# Patient Record
Sex: Male | Born: 1970 | Race: White | Hispanic: No | Marital: Married | Smoking: Never smoker
Health system: Southern US, Community
[De-identification: ages and names within clinical notes are randomized; demographics above are authoritative.]

## PROBLEM LIST (undated history)

## (undated) DIAGNOSIS — E785 Hyperlipidemia, unspecified: Secondary | ICD-10-CM

## (undated) DIAGNOSIS — K219 Gastro-esophageal reflux disease without esophagitis: Secondary | ICD-10-CM

## (undated) DIAGNOSIS — J309 Allergic rhinitis, unspecified: Secondary | ICD-10-CM

## (undated) DIAGNOSIS — E119 Type 2 diabetes mellitus without complications: Secondary | ICD-10-CM

## (undated) DIAGNOSIS — S335XXA Sprain of ligaments of lumbar spine, initial encounter: Secondary | ICD-10-CM

## (undated) HISTORY — PX: BACK SURGERY: SHX140

## (undated) HISTORY — DX: Allergic rhinitis, unspecified: J30.9

## (undated) HISTORY — DX: Hyperlipidemia, unspecified: E78.5

## (undated) HISTORY — DX: Gastro-esophageal reflux disease without esophagitis: K21.9

## (undated) HISTORY — DX: Sprain of ligaments of lumbar spine, initial encounter: S33.5XXA

---
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
## 2000-04-22 ENCOUNTER — Emergency Department (HOSPITAL_COMMUNITY): Admission: EM | Admit: 2000-04-22 | Discharge: 2000-04-22 | Payer: Self-pay | Admitting: Emergency Medicine

## 2000-04-30 ENCOUNTER — Emergency Department (HOSPITAL_COMMUNITY): Admission: EM | Admit: 2000-04-30 | Discharge: 2000-04-30 | Payer: Self-pay | Admitting: *Deleted

## 2008-03-25 ENCOUNTER — Encounter: Payer: Self-pay | Admitting: Family Medicine

## 2008-12-31 ENCOUNTER — Ambulatory Visit: Payer: Self-pay | Admitting: Family Medicine

## 2008-12-31 LAB — CONVERTED CEMR LAB
Ketones, urine, test strip: NEGATIVE
Nitrite: NEGATIVE
Protein, U semiquant: NEGATIVE
Urobilinogen, UA: 0.2

## 2009-01-03 LAB — CONVERTED CEMR LAB
AST: 27 units/L (ref 0–37)
Albumin: 3.9 g/dL (ref 3.5–5.2)
Alkaline Phosphatase: 50 units/L (ref 39–117)
Basophils Absolute: 0 10*3/uL (ref 0.0–0.1)
Basophils Relative: 1 % (ref 0.0–3.0)
Bilirubin, Direct: 0.1 mg/dL (ref 0.0–0.3)
Calcium: 9.2 mg/dL (ref 8.4–10.5)
Creatinine, Ser: 1.1 mg/dL (ref 0.4–1.5)
GFR calc non Af Amer: 78.86 mL/min (ref >60)
HDL: 13.2 mg/dL — ABNORMAL LOW (ref >39.00)
Hemoglobin: 13.8 g/dL (ref 13.0–17.0)
Lymphocytes Relative: 38 % (ref 12.0–46.0)
Monocytes Relative: 5.8 % (ref 3.0–12.0)
Neutro Abs: 2.6 10*3/uL (ref 1.4–7.7)
Neutrophils Relative %: 52.2 % (ref 43.0–77.0)
RBC: 4.42 M/uL (ref 4.22–5.81)
Sodium: 140 meq/L (ref 135–145)
Total CHOL/HDL Ratio: 29
Total Protein: 7.2 g/dL (ref 6.0–8.3)
Triglycerides: 187 mg/dL — ABNORMAL HIGH (ref 0.0–149.0)
VLDL: 37.4 mg/dL (ref 0.0–40.0)

## 2009-01-07 ENCOUNTER — Ambulatory Visit: Payer: Self-pay | Admitting: Family Medicine

## 2009-01-07 DIAGNOSIS — J309 Allergic rhinitis, unspecified: Secondary | ICD-10-CM

## 2009-01-07 DIAGNOSIS — K219 Gastro-esophageal reflux disease without esophagitis: Secondary | ICD-10-CM

## 2009-01-07 DIAGNOSIS — E785 Hyperlipidemia, unspecified: Secondary | ICD-10-CM

## 2009-01-07 HISTORY — DX: Hyperlipidemia, unspecified: E78.5

## 2009-01-07 HISTORY — DX: Allergic rhinitis, unspecified: J30.9

## 2009-01-07 HISTORY — DX: Gastro-esophageal reflux disease without esophagitis: K21.9

## 2009-02-14 ENCOUNTER — Telehealth: Payer: Self-pay | Admitting: Family Medicine

## 2009-02-17 ENCOUNTER — Ambulatory Visit: Payer: Self-pay | Admitting: Family Medicine

## 2009-02-19 LAB — CONVERTED CEMR LAB
Cholesterol: 205 mg/dL — ABNORMAL HIGH (ref 0–200)
Direct LDL: 162.2 mg/dL
Total CHOL/HDL Ratio: 7
Triglycerides: 103 mg/dL (ref 0.0–149.0)
VLDL: 20.6 mg/dL (ref 0.0–40.0)

## 2010-01-16 ENCOUNTER — Telehealth: Payer: Self-pay | Admitting: Family Medicine

## 2010-01-22 ENCOUNTER — Ambulatory Visit: Payer: Self-pay | Admitting: Family Medicine

## 2010-01-22 LAB — CONVERTED CEMR LAB
ALT: 28 units/L (ref 0–53)
AST: 19 units/L (ref 0–37)
Alkaline Phosphatase: 85 units/L (ref 39–117)
BUN: 22 mg/dL (ref 6–23)
Basophils Relative: 0.8 % (ref 0.0–3.0)
Bilirubin, Direct: 0.1 mg/dL (ref 0.0–0.3)
Chloride: 102 meq/L (ref 96–112)
Cholesterol: 243 mg/dL — ABNORMAL HIGH (ref 0–200)
Creatinine, Ser: 1 mg/dL (ref 0.4–1.5)
Direct LDL: 170 mg/dL
Eosinophils Relative: 3.1 % (ref 0.0–5.0)
GFR calc non Af Amer: 89.62 mL/min (ref >60)
Lymphocytes Relative: 24.6 % (ref 12.0–46.0)
MCV: 90.3 fL (ref 78.0–100.0)
Monocytes Absolute: 0.5 10*3/uL (ref 0.1–1.0)
Monocytes Relative: 7.2 % (ref 3.0–12.0)
Neutrophils Relative %: 64.3 % (ref 43.0–77.0)
Platelets: 203 10*3/uL (ref 150.0–400.0)
RBC: 4.55 M/uL (ref 4.22–5.81)
Total Bilirubin: 0.6 mg/dL (ref 0.3–1.2)
Total CHOL/HDL Ratio: 7
Total Protein: 7 g/dL (ref 6.0–8.3)
VLDL: 36 mg/dL (ref 0.0–40.0)
WBC: 7.2 10*3/uL (ref 4.5–10.5)

## 2010-01-29 ENCOUNTER — Ambulatory Visit: Payer: Self-pay | Admitting: Family Medicine

## 2010-01-29 DIAGNOSIS — S335XXA Sprain of ligaments of lumbar spine, initial encounter: Secondary | ICD-10-CM

## 2010-01-29 HISTORY — DX: Sprain of ligaments of lumbar spine, initial encounter: S33.5XXA

## 2010-01-30 ENCOUNTER — Encounter: Admission: RE | Admit: 2010-01-30 | Discharge: 2010-03-03 | Payer: Self-pay | Admitting: Family Medicine

## 2010-01-30 ENCOUNTER — Encounter: Payer: Self-pay | Admitting: Family Medicine
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
## 2010-09-29 NOTE — Progress Notes (Signed)
Summary: refill phenergan  Phone Note Call from Patient   Caller: Patient Call For: Evelena Peat MD Summary of Call: Pt needs to refill Phenergan today!  He uses these when flying and he is leaving town Monday.  Is asking if there is anyway we can do this? [phone]  [facility] Initial call taken by: Lynann Beaver CMA,  Jan 16, 2010 12:45 PM  Follow-up for Phone Call        called pt - we fill on 01/07/2010 # [facility]. He said he would check to see if refills we there. not leaving until 1 pm on manday - instructed to call at 8am if not there and we would call ing to the pharmacist - not the doctor's line. KIK Follow-up by: Duard Benyamin LPN,  Jan 16, 2010 5:08 PM

## 2010-09-29 NOTE — Miscellaneous (Signed)
Summary: Initial Summary for PT Services/[facility]  Initial Summary for PT Services/[facility]   Imported By: Maryln Gottron 02/10/2010 09:40:06  _____________________________________________________________________  External Attachment:    Type:   Image     Comment:   External Document

## 2010-09-29 NOTE — Miscellaneous (Signed)
Summary: Discharge Summary for PT [facility]  Discharge Summary for PT Services/[facility]   Imported By: Maryln Gottron 03/09/2010 12:28:55  _____________________________________________________________________  External Attachment:    Type:   Image     Comment:   External Document

## 2010-11-09 ENCOUNTER — Other Ambulatory Visit: Payer: Self-pay | Admitting: Family Medicine

## 2011-02-12 ENCOUNTER — Other Ambulatory Visit: Payer: Self-pay

## 2011-02-16 ENCOUNTER — Other Ambulatory Visit (INDEPENDENT_AMBULATORY_CARE_PROVIDER_SITE_OTHER): Payer: BC Managed Care – PPO

## 2011-02-16 DIAGNOSIS — Z0000 Encounter for general adult medical examination without abnormal findings: Secondary | ICD-10-CM

## 2011-02-16 LAB — LIPID PANEL
Cholesterol: 316 mg/dL — ABNORMAL HIGH (ref 0–200)
Total CHOL/HDL Ratio: 8
Triglycerides: 140 mg/dL (ref 0.0–149.0)
VLDL: 28 mg/dL (ref 0.0–40.0)

## 2011-02-16 LAB — POCT URINALYSIS DIPSTICK
Blood, UA: NEGATIVE
Glucose, UA: NEGATIVE
Ketones, UA: NEGATIVE
Spec Grav, UA: 1.03
Urobilinogen, UA: 0.2

## 2011-02-16 LAB — CBC WITH DIFFERENTIAL/PLATELET
Basophils Relative: 0.7 % (ref 0.0–3.0)
Eosinophils Relative: 4 % (ref 0.0–5.0)
HCT: 41.1 % (ref 39.0–52.0)
Lymphs Abs: 1.9 10*3/uL (ref 0.7–4.0)
Monocytes Relative: 8.3 % (ref 3.0–12.0)
Neutrophils Relative %: 56 % (ref 43.0–77.0)
Platelets: 229 10*3/uL (ref 150.0–400.0)
RBC: 4.55 Mil/uL (ref 4.22–5.81)
WBC: 6.2 10*3/uL (ref 4.5–10.5)

## 2011-02-16 LAB — BASIC METABOLIC PANEL
BUN: 17 mg/dL (ref 6–23)
Chloride: 106 mEq/L (ref 96–112)
GFR: 76.42 mL/min (ref >60.00)
Potassium: 4.5 mEq/L (ref 3.5–5.1)

## 2011-02-16 LAB — LDL CHOLESTEROL, DIRECT: Direct LDL: 250 mg/dL

## 2011-02-16 LAB — HEPATIC FUNCTION PANEL
Albumin: 4.4 g/dL (ref 3.5–5.2)
Total Bilirubin: 0.7 mg/dL (ref 0.3–1.2)

## 2011-02-17 ENCOUNTER — Encounter: Payer: Self-pay | Admitting: Family Medicine

## 2011-02-22 ENCOUNTER — Other Ambulatory Visit: Payer: Self-pay | Admitting: *Deleted

## 2011-02-22 ENCOUNTER — Encounter: Payer: Self-pay | Admitting: Family Medicine

## 2011-02-22 NOTE — Telephone Encounter (Signed)
Opened in error

## 2011-02-23 ENCOUNTER — Encounter: Payer: Self-pay | Admitting: Family Medicine

## 2011-02-23 ENCOUNTER — Ambulatory Visit (INDEPENDENT_AMBULATORY_CARE_PROVIDER_SITE_OTHER): Payer: BC Managed Care – PPO | Admitting: Family Medicine

## 2011-02-23 VITALS — BP 126/82 | HR 74 | Temp 98.4°F | Wt 211.0 lb

## 2011-02-23 DIAGNOSIS — R110 Nausea: Secondary | ICD-10-CM

## 2011-02-23 DIAGNOSIS — E785 Hyperlipidemia, unspecified: Secondary | ICD-10-CM

## 2011-02-23 DIAGNOSIS — Z0000 Encounter for general adult medical examination without abnormal findings: Secondary | ICD-10-CM

## 2011-02-23 DIAGNOSIS — G4700 Insomnia, unspecified: Secondary | ICD-10-CM

## 2011-02-23 DIAGNOSIS — R7309 Other abnormal glucose: Secondary | ICD-10-CM

## 2011-02-23 DIAGNOSIS — R7303 Prediabetes: Secondary | ICD-10-CM | POA: Insufficient documentation

## 2011-02-23 MED ORDER — PROMETHAZINE HCL 25 MG PO TABS: 25.0000 mg | ORAL_TABLET | Freq: Four times a day (QID) | ORAL | Status: DC | PRN

## 2011-02-23 MED ORDER — TEMAZEPAM 15 MG PO CAPS: 15.0000 mg | ORAL_CAPSULE | Freq: Every evening | ORAL | Status: AC | PRN

## 2011-02-23 NOTE — Patient Instructions (Signed)
Diabetes Meal Planning Guide The diabetes meal planning guide is a tool to help you plan your meals and snacks. It is important for people with diabetes to manage their blood sugar levels. Choosing the right foods and the right amounts throughout your day will help control your blood sugar. Eating right can even help you improve your blood pressure and reach or maintain a healthy weight. CARBOHYDRATE COUNTING MADE EASY When you eat carbohydrates, they turn to sugar (glucose). This raises your blood sugar level. Counting carbohydrates can help you control this level so you feel better. When you plan your meals by counting carbohydrates, you can have more flexibility in what you eat and balance your medicine with your food intake. Carbohydrate counting simply means adding up the total amount of carbohydrate grams (g) in your meals or snacks. Try to eat about the same amount at each meal. Foods with carbohydrates are listed below. Each portion below is 1 carbohydrate serving or 15 grams of carbohydrates. Ask your dietician how many grams of carbohydrates you should eat at each meal or snack. Grains and Starches 1 slice bread 1/2 English muffin or hotdog/hamburger bun 3/4 cup cold cereal (unsweetened) 1/3 cup cooked pasta or rice 1/2 cup starchy vegetables (corn, potatoes, peas, beans, winter squash) 1 tortilla (6 inches) 1/4 bagel 1 waffle or pancake (size of a CD) 1/2 cup cooked cereal 4 to 6 small crackers *Whole grain is recommended Fruit 1 cup fresh unsweetened berries, melon, papaya, pineapple 1 small fresh fruit 1/2 banana or mango 1/2 cup fruit juice (4 ounces unsweetened) 1/2 cup canned fruit in natural juice or water 2 tablespoons dried fruit 12 to 15 grapes or cherries Milk and Yogurt 1 cup fat-free or 1% milk 1 cup soy milk 6 ounces light yogurt with sugar-free sweetener 6 ounces low-fat soy yogurt 6 ounces plain yogurt Vegetables 1 cup raw or 1/2 cup cooked is counted as 0  carbohydrates or a "free" food. If you eat 3 or more servings at one meal, count them as 1 carbohydrate serving. Other Carbohydrates 3/4 ounces chips or pretzels 1/2 cup ice cream or frozen yogurt 1/4 cup sherbet or sorbet 2 inch square cake, no frosting 1 tablespoon honey, sugar, jam, jelly, or syrup 2 small cookies 3 squares of graham crackers 3 cups popcorn 6 crackers 1 cup broth-based soup Count 1 cup casserole or other mixed foods as 2 carbohydrate servings. Foods with less than 20 calories in a serving may be counted as 0 carbohydrates or a "free" food. You may want to purchase a book or computer software that lists the carbohydrate gram counts of different foods. In addition, the nutrition facts panel on the labels of the foods you eat are a good source of this information. The label will tell you how big the serving size is and the total number of carbohydrate grams you will be eating per serving. Divide this number by 15 to obtain the number of carbohydrate servings in a portion. Remember: 1 carbohydrate serving equals 15 grams of carbohydrate. SERVING SIZES Measuring foods and serving sizes helps you make sure you are getting the right amount of food. The list below tells how big or small some common serving sizes are.  1 ounce (oz) of cheese.................................4 stacked dice.   2 to 3 oz cooked meat..................................Deck of cards.   1 teaspoon (tsp)............................................Tip of little finger.   1 tablespoon (tbs).........................................Thumb.   2 tbs.............................................................Golf ball.    cup...........................................................Half of a fist.   1 cup............................................................A fist.    SAMPLE DIABETES MEAL PLAN Below is a sample meal plan that includes foods from the grain and starches, dairy, vegetable, fruit, and  meat groups. A dietician can individualize a meal plan to fit your calorie needs and tell you the number of servings needed from each food group. However, controlling the total amount of carbohydrates in your meal or snack is more important than making sure you include all of the food groups at every meal. You may interchange carbohydrate containing foods (dairy, starches, and fruits). The meal plan below is an example of a 2000 calorie diet using carbohydrate counting. This meal plan has 17 carbohydrate servings (carb choices). Breakfast 1 cup oatmeal (2 carb choices) 3/4 cup light yogurt (1 carb choice) 1 cup blueberries (1 carb choice) 1/4 cup almonds  Snack 1 large apple (2 carb choices) 1 low-fat string cheese stick  Lunch Chicken breast salad:  1 cup spinach   1/4 cup chopped tomatoes   2 oz chicken breast, sliced   2 tbs low-fat [place] dressing  12 whole-wheat crackers (2 carb choices) 12 to 15 grapes (1 carb choice) 1 cup low-fat milk (1 carb choice)  Snack 1 cup carrots 1/2 cup hummus (1 carb choice)  Dinner 3 oz broiled salmon 1 cup brown rice (3 carb choices)  Snack 1 1/2 cups steamed broccoli (1 carb choice) drizzled with 1 tsp olive oil and lemon juice 1 cup light pudding (2 carb choices)  DIABETES MEAL PLANNING WORKSHEET Your dietician can use this worksheet to help you decide how many servings of foods and what types of foods are right for you.  Breakfast Food Group and Servings Carb Choices Grain/Starches _______________________________________ Dairy ______________________________________________ Vegetable _______________________________________ Fruit _______________________________________________ Meat _______________________________________________ Fat _____________________________________________ Lunch Food Group and Servings Carb Choices Grain/Starches ________________________________________ Dairy _______________________________________________ Fruit  ________________________________________________ Meat ________________________________________________ Fat _____________________________________________ Dinner Food Group and Servings Carb Choices Grain/Starches ________________________________________ Dairy _______________________________________________ Fruit ________________________________________________ Meat ________________________________________________ Fat _____________________________________________ Snacks Food Group and Servings Carb Choices Grain/Starches ________________________________________ Dairy _______________________________________________ Vegetable ________________________________________ Fruit ________________________________________________ Meat ________________________________________________ Fat _____________________________________________ Daily Totals Starches _________________________ Vegetable __________________________ Fruit ______________________________ Dairy ______________________________ Meat ______________________________ Fat ________________________________  Document Released: 05/13/2005 Document Re-Released: 02/03/2010 [employer] Patient Information 2011 [place], [place].Insomnia Insomnia is frequent trouble falling and/or staying asleep. Insomnia can be a long term problem or a short term problem. Both are common. Insomnia can be a short term problem when the wakefulness is related to a certain stress or worry. Long term insomnia is often related to ongoing stress during waking hours and/or poor sleeping habits. Overtime, sleep deprivation itself can make the problem worse. Every little thing feels more severe because you are overtired and your ability to cope is decreased. SYMPTOMS  Not feeling rested in the morning.   Anxiety and restlessness at bedtime.   Difficulty falling and staying asleep.  CAUSES  Stress, anxiety, and depression.   Poor sleeping habits.   Distractions such as TV  in the bedroom.   Naps close to bedtime.   Engaging in emotionally charged conversations before bed.   Technical reading before sleep.   Alcohol and other sedatives. They may make the problem worse. They can hurt normal sleep patterns and normal dream activity.   Stimulants such as caffeine for several hours prior to bedtime.   Pain syndromes and shortness of breath can cause insomnia.   Exercise late at night.   Changing time zones may cause sleeping problems (jet lag).  It is sometimes helpful to have someone observe your sleeping patterns. They  should look for periods of not breathing during the night (sleep apnea). They should also look to see how long those periods last. If you live alone or observers are uncertain, you can also be observed at a sleep clinic where your sleep patterns will be professionally monitored. Sleep apnea requires a checkup and treatment. Give your caregivers your medical history. Give your caregivers observations your family has made about your sleep.  TREATMENT  Your caregiver may prescribe treatment for an underlying medical disorders. Your caregiver can give advice or help if you are using alcohol or other drugs for self-medication. Treatment of underlying problems will usually eliminate insomnia problems.   Medications can be prescribed for short time use. They are generally not recommended for lengthy use.   Over-the-counter sleep medicines are not recommended for lengthy use. They can be habit forming.   You can promote easier sleeping by making lifestyle changes such as:   Using relaxation techniques that help with breathing and reduce muscle tension.   Exercising earlier in the day.   Changing your diet and the time of your last meal. No night time snacks.   Establish a regular time to go to bed.   Counseling can help with stressful problems and worry.   Soothing music and white noise may be helpful if there are background noises you cannot  remove.   Stop tedious detailed work at least one hour before bedtime.  HOME CARE INSTRUCTIONS  Keep a diary. Inform your caregiver about your progress. This includes any medication side effects. See your caregiver regularly. Take note of:   Times when you are asleep.  Times when you are awake during the night.   The quality of your sleep.  How you feel the next day.   This information will help your caregiver care for you.  Get out of bed if you are still awake after 15 minutes. Read or do some quiet activity. Keep the lights down. Wait until you feel sleepy and go back to bed.   Keep regular sleeping and waking hours. Avoid naps.   Exercise regularly.   Avoid distractions at bedtime. Distractions include watching television or engaging in any intense or detailed activity like attempting to balance the household checkbook.   Develop a bedtime ritual. Keep a familiar routine of bathing, brushing your teeth, climbing into bed at the same time each night, listening to soothing music. Routines increase the success of falling to sleep faster.   Use relaxation techniques. This can be using breathing and muscle tension release routines. It can also include visualizing peaceful scenes. You can also help control troubling or intruding thoughts by keeping your mind occupied with boring or repetitive thoughts like the old concept of counting sheep. You can make it more creative like imagining planting one beautiful flower after another in your backyard garden.   During your day, work to eliminate stress. When this is not possible use some of the previous suggestions to help reduce the anxiety that accompanies stressful situations.  MAKE SURE YOU:   Understand these instructions.   Will watch your condition.   Will get help right away if you are not doing well or get worse.  Document Released: 08/13/2000 Document Re-Released: 07/29/2008 [facility] Patient Information 2011 [place], [place].

## 2011-02-23 NOTE — Progress Notes (Signed)
  Subjective:    Patient ID: Kristopher Holland, male    DOB: 12-25-1970, 40 y.o.   MRN: [id number]  HPI Here for CPE.   Very stressful year with loss of job. Overall feels well.  Exercising regularly.  Tetanus is up to date. Not taking any meds.  Prior intolerance to many statins.  Hx of prediabetes.   No symptoms of hyperglycemia.  Recent issues with insomnia.  No ETOH and no daytime naps.  Denies depression.  No late day caffeine use and very little caffeine use at all.  Recurrent nausea, esp with movement.  No vertigo.  No abd pain.  Requesting refills phenergan which helps.  Past Medical History  Diagnosis Date  . HYPERLIPIDEMIA 01/07/2009  . ALLERGIC RHINITIS 01/07/2009  . GERD 01/07/2009  . LUMBAR STRAIN 01/29/2010   Past Surgical History  Procedure Date  . Tonsillectomy 1976    reports that he has never smoked. He does not have any smokeless tobacco history on file. His alcohol and drug histories not on file. family history includes Cancer in his father, other, and paternal grandfather. Allergies  Allergen Reactions  . Penicillins     REACTION: rash     Review of Systems  Constitutional: Negative for fever, activity change, appetite change, fatigue and unexpected weight change.  HENT: Negative for ear pain, congestion and trouble swallowing.   Eyes: Negative for pain and visual disturbance.  Respiratory: Negative for cough, shortness of breath and wheezing.   Cardiovascular: Negative for chest pain and palpitations.  Gastrointestinal: Negative for nausea, vomiting, abdominal pain, diarrhea, constipation, blood in stool, abdominal distention and rectal pain.  Genitourinary: Negative for dysuria, hematuria and testicular pain.  Musculoskeletal: Negative for joint swelling and arthralgias.  Skin: Negative for rash.  Neurological: Negative for dizziness, syncope and headaches.  Hematological: Negative for adenopathy. Does not bruise/bleed easily.  Psychiatric/Behavioral:  Negative for confusion and dysphoric mood.       Objective:   Physical Exam  Constitutional: He is oriented to person, place, and time. He appears well-developed and well-nourished. No distress.  HENT:  Head: Normocephalic and atraumatic.  Right Ear: External ear normal.  Left Ear: External ear normal.  Mouth/Throat: Oropharynx is clear and moist.  Eyes: Conjunctivae and EOM are normal. Pupils are equal, round, and reactive to light.  Neck: Normal range of motion. Neck supple. No thyromegaly present.  Cardiovascular: Normal rate, regular rhythm and normal heart sounds.   No murmur heard. Pulmonary/Chest: No respiratory distress. He has no wheezes. He has no rales.  Abdominal: Soft. Bowel sounds are normal. He exhibits no distension and no mass. There is no tenderness. There is no rebound and no guarding.  Musculoskeletal: He exhibits no edema.  Lymphadenopathy:    He has no cervical adenopathy.  Neurological: He is alert and oriented to person, place, and time. He displays normal reflexes. No cranial nerve deficit.  Skin: No rash noted.  Psychiatric: He has a normal mood and affect.          Assessment & Plan:  Generally healthy 40 year old male.  Hyperlipidemia and reluctant to try other statins.  He does remain on omega 3 supplement.  Glucose has increased but still in prediabetes range.  Work on weight loss.  For insomnia, sleep hygiene discussed.  Short term trial of restoril. F/U 4-6 months for repeat fasting lipids an glucose.  Hyperlipidemia discussed.  Discussed role of diet and exercise.  He is reluctant to try any other statins.

## 2012-05-11 ENCOUNTER — Other Ambulatory Visit (INDEPENDENT_AMBULATORY_CARE_PROVIDER_SITE_OTHER): Payer: BC Managed Care – PPO

## 2012-05-11 DIAGNOSIS — Z0000 Encounter for general adult medical examination without abnormal findings: Secondary | ICD-10-CM

## 2012-05-11 LAB — LIPID PANEL
Cholesterol: 248 mg/dL — ABNORMAL HIGH (ref 0–200)
HDL: 30.2 mg/dL — ABNORMAL LOW (ref >39.00)
Triglycerides: 209 mg/dL — ABNORMAL HIGH (ref 0.0–149.0)

## 2012-05-11 LAB — CBC WITH DIFFERENTIAL/PLATELET
Basophils Absolute: 0 10*3/uL (ref 0.0–0.1)
Eosinophils Absolute: 0.2 10*3/uL (ref 0.0–0.7)
Eosinophils Relative: 2.7 % (ref 0.0–5.0)
HCT: 41.3 % (ref 39.0–52.0)
Lymphs Abs: 2.1 10*3/uL (ref 0.7–4.0)
MCV: 90.4 fl (ref 78.0–100.0)
Monocytes Absolute: 0.6 10*3/uL (ref 0.1–1.0)
Neutrophils Relative %: 60 % (ref 43.0–77.0)
Platelets: 258 10*3/uL (ref 150.0–400.0)
RDW: 14.4 % (ref 11.5–14.6)
WBC: 7.3 10*3/uL (ref 4.5–10.5)

## 2012-05-11 LAB — POCT URINALYSIS DIPSTICK
Leukocytes, UA: NEGATIVE
Nitrite, UA: NEGATIVE
Protein, UA: NEGATIVE
Urobilinogen, UA: 0.2
pH, UA: 7

## 2012-05-11 LAB — TSH: TSH: 0.9 u[IU]/mL (ref 0.35–5.50)

## 2012-05-11 LAB — BASIC METABOLIC PANEL
CO2: 25 mEq/L (ref 19–32)
Calcium: 9.6 mg/dL (ref 8.4–10.5)
Chloride: 104 mEq/L (ref 96–112)
Sodium: 141 mEq/L (ref 135–145)

## 2012-05-11 LAB — HEPATIC FUNCTION PANEL
Albumin: 4.2 g/dL (ref 3.5–5.2)
Alkaline Phosphatase: 88 U/L (ref 39–117)
Total Protein: 7.7 g/dL (ref 6.0–8.3)

## 2012-05-12 LAB — LDL CHOLESTEROL, DIRECT: Direct LDL: 169.3 mg/dL

## 2012-05-18 ENCOUNTER — Ambulatory Visit (INDEPENDENT_AMBULATORY_CARE_PROVIDER_SITE_OTHER): Payer: BC Managed Care – PPO | Admitting: Family Medicine

## 2012-05-18 ENCOUNTER — Encounter: Payer: Self-pay | Admitting: Family Medicine

## 2012-05-18 VITALS — BP 130/80 | HR 72 | Temp 98.1°F | Resp 12 | Ht 68.5 in | Wt 211.0 lb

## 2012-05-18 DIAGNOSIS — Z0000 Encounter for general adult medical examination without abnormal findings: Secondary | ICD-10-CM

## 2012-05-18 DIAGNOSIS — R319 Hematuria, unspecified: Secondary | ICD-10-CM

## 2012-05-18 NOTE — Progress Notes (Signed)
  Subjective:    Patient ID: Kristopher Holland, male    DOB: September 12, 1970, 41 y.o.   MRN: [id number]  HPI  Patient seen for complete physical. He has history of dyslipidemia, seasonal allergic rhinitis, and GERD. GERD symptoms well controlled on omeprazole. No dysphagia. History of prediabetes. No symptoms of hyperglycemia. Exercises about 4 days a week. Combining aerobic and resistance training. Tetanus 2007. Patient refuses flu vaccine.  Has never smoked. Tried statins in the past including Crestor Lipitor but had side effects. He has refused further statin therapy. No family history of premature CAD.  Past Medical History  Diagnosis Date  . HYPERLIPIDEMIA 01/07/2009  . ALLERGIC RHINITIS 01/07/2009  . GERD 01/07/2009  . LUMBAR STRAIN 01/29/2010   Past Surgical History  Procedure Date  . Tonsillectomy 1976    reports that he has never smoked. He does not have any smokeless tobacco history on file. His alcohol and drug histories not on file. family history includes Cancer in his father, other, and paternal grandfather and Diabetes in his paternal grandfather. Allergies  Allergen Reactions  . Penicillins     REACTION: rash      Review of Systems  Constitutional: Negative for fever, activity change, appetite change and fatigue.  HENT: Negative for ear pain, congestion and trouble swallowing.   Eyes: Negative for pain and visual disturbance.  Respiratory: Negative for cough, shortness of breath and wheezing.   Cardiovascular: Negative for chest pain and palpitations.  Gastrointestinal: Negative for nausea, vomiting, abdominal pain, diarrhea, constipation, blood in stool, abdominal distention and rectal pain.  Genitourinary: Negative for dysuria, hematuria and testicular pain.  Musculoskeletal: Negative for joint swelling and arthralgias.  Skin: Negative for rash.  Neurological: Negative for dizziness, syncope and headaches.  Hematological: Negative for adenopathy.    Psychiatric/Behavioral: Negative for confusion and dysphoric mood.       Objective:   Physical Exam  Constitutional: He is oriented to person, place, and time. He appears well-developed and well-nourished. No distress.  HENT:  Head: Normocephalic and atraumatic.  Right Ear: External ear normal.  Left Ear: External ear normal.  Mouth/Throat: Oropharynx is clear and moist.  Eyes: Conjunctivae normal and EOM are normal. Pupils are equal, round, and reactive to light.  Neck: Normal range of motion. Neck supple. No thyromegaly present.  Cardiovascular: Normal rate, regular rhythm and normal heart sounds.   No murmur heard. Pulmonary/Chest: No respiratory distress. He has no wheezes. He has no rales.  Abdominal: Soft. Bowel sounds are normal. He exhibits no distension and no mass. There is no tenderness. There is no rebound and no guarding.  Musculoskeletal: He exhibits no edema.  Lymphadenopathy:    He has no cervical adenopathy.  Neurological: He is alert and oriented to person, place, and time. He displays normal reflexes. No cranial nerve deficit.  Skin: No rash noted.  Psychiatric: He has a normal mood and affect.          Assessment & Plan:  Generally healthy 41 year old male. Flu vaccine offered and declined. Tetanus up-to-date. Labs discussed. Return in one week for urine dipstick. Continue regular exercise habits.

## 2012-08-15 ENCOUNTER — Encounter (HOSPITAL_COMMUNITY): Payer: Self-pay | Admitting: *Deleted

## 2012-08-15 ENCOUNTER — Emergency Department (HOSPITAL_COMMUNITY): Payer: BC Managed Care – PPO

## 2012-08-15 ENCOUNTER — Emergency Department (HOSPITAL_COMMUNITY)
Admission: EM | Admit: 2012-08-15 | Discharge: 2012-08-16 | Disposition: A | Discharge status: Home / Self Care | Payer: BC Managed Care – PPO | Attending: Emergency Medicine | Admitting: Emergency Medicine

## 2012-08-15 DIAGNOSIS — S01502A Unspecified open wound of oral cavity, initial encounter: Secondary | ICD-10-CM | POA: Insufficient documentation

## 2012-08-15 DIAGNOSIS — J309 Allergic rhinitis, unspecified: Secondary | ICD-10-CM | POA: Insufficient documentation

## 2012-08-15 DIAGNOSIS — S01512A Laceration without foreign body of oral cavity, initial encounter: Secondary | ICD-10-CM

## 2012-08-15 DIAGNOSIS — S93601A Unspecified sprain of right foot, initial encounter: Secondary | ICD-10-CM

## 2012-08-15 DIAGNOSIS — Z79899 Other long term (current) drug therapy: Secondary | ICD-10-CM | POA: Insufficient documentation

## 2012-08-15 DIAGNOSIS — Y9389 Activity, other specified: Secondary | ICD-10-CM | POA: Insufficient documentation

## 2012-08-15 DIAGNOSIS — K219 Gastro-esophageal reflux disease without esophagitis: Secondary | ICD-10-CM | POA: Insufficient documentation

## 2012-08-15 DIAGNOSIS — Y92410 Unspecified street and highway as the place of occurrence of the external cause: Secondary | ICD-10-CM | POA: Insufficient documentation

## 2012-08-15 DIAGNOSIS — S60222A Contusion of left hand, initial encounter: Secondary | ICD-10-CM

## 2012-08-15 DIAGNOSIS — S60229A Contusion of unspecified hand, initial encounter: Secondary | ICD-10-CM | POA: Insufficient documentation

## 2012-08-15 DIAGNOSIS — Z87828 Personal history of other (healed) physical injury and trauma: Secondary | ICD-10-CM | POA: Insufficient documentation

## 2012-08-15 DIAGNOSIS — S93609A Unspecified sprain of unspecified foot, initial encounter: Secondary | ICD-10-CM | POA: Insufficient documentation

## 2012-08-15 DIAGNOSIS — E785 Hyperlipidemia, unspecified: Secondary | ICD-10-CM | POA: Insufficient documentation

## 2012-08-15 NOTE — ED Notes (Signed)
Tea bag applied to the pts tongue until he is seen
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
## 2012-08-15 NOTE — ED Notes (Signed)
The pt is not on any blood thinners
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
## 2012-08-16 NOTE — Progress Notes (Signed)
Orthopedic Tech Progress Note Patient Details:  Kristopher Holland 1971-04-25 [id number]  Ortho Devices Type of Ortho Device: ASO   Kristopher Holland 08/16/2012, 12:07 AM

## 2012-08-17 NOTE — ED Provider Notes (Signed)
Medical screening examination/treatment/procedure(s) were performed by non-physician practitioner and as supervising physician I was immediately available for consultation/collaboration. Blase Beckner, MD, FACEP   Ogden Handlin L Johathon Overturf, MD 08/17/12 0702 

## 2012-10-14 ENCOUNTER — Other Ambulatory Visit: Payer: Self-pay

## 2013-05-14 ENCOUNTER — Other Ambulatory Visit (INDEPENDENT_AMBULATORY_CARE_PROVIDER_SITE_OTHER): Payer: 59

## 2013-05-14 DIAGNOSIS — Z0000 Encounter for general adult medical examination without abnormal findings: Secondary | ICD-10-CM

## 2013-05-14 DIAGNOSIS — R319 Hematuria, unspecified: Secondary | ICD-10-CM

## 2013-05-14 LAB — LIPID PANEL
Cholesterol: 226 mg/dL — ABNORMAL HIGH (ref 0–200)
HDL: 27.7 mg/dL — ABNORMAL LOW (ref >39.00)
Total CHOL/HDL Ratio: 8
Triglycerides: 167 mg/dL — ABNORMAL HIGH (ref 0.0–149.0)
VLDL: 33.4 mg/dL (ref 0.0–40.0)

## 2013-05-14 LAB — CBC WITH DIFFERENTIAL/PLATELET
Basophils Absolute: 0 10*3/uL (ref 0.0–0.1)
Basophils Relative: 0.7 % (ref 0.0–3.0)
Eosinophils Absolute: 0.2 10*3/uL (ref 0.0–0.7)
Lymphocytes Relative: 32.6 % (ref 12.0–46.0)
MCHC: 34.4 g/dL (ref 30.0–36.0)
Monocytes Relative: 7.2 % (ref 3.0–12.0)
Neutrophils Relative %: 56.1 % (ref 43.0–77.0)
RBC: 4.55 Mil/uL (ref 4.22–5.81)
RDW: 14.1 % (ref 11.5–14.6)

## 2013-05-14 LAB — POCT URINALYSIS DIPSTICK
Ketones, UA: NEGATIVE
Leukocytes, UA: NEGATIVE
Protein, UA: NEGATIVE
Urobilinogen, UA: 0.2
pH, UA: 7.5

## 2013-05-14 LAB — HEPATIC FUNCTION PANEL
ALT: 30 U/L (ref 0–53)
AST: 22 U/L (ref 0–37)
Albumin: 3.9 g/dL (ref 3.5–5.2)
Alkaline Phosphatase: 88 U/L (ref 39–117)
Bilirubin, Direct: 0 mg/dL (ref 0.0–0.3)
Total Bilirubin: 0.4 mg/dL (ref 0.3–1.2)
Total Protein: 7 g/dL (ref 6.0–8.3)

## 2013-05-14 LAB — LDL CHOLESTEROL, DIRECT: Direct LDL: 168.9 mg/dL

## 2013-05-14 LAB — BASIC METABOLIC PANEL
CO2: 29 mEq/L (ref 19–32)
Calcium: 8.9 mg/dL (ref 8.4–10.5)
Creatinine, Ser: 1.1 mg/dL (ref 0.4–1.5)
GFR: 81.47 mL/min (ref >60.00)
Sodium: 137 mEq/L (ref 135–145)

## 2013-05-14 LAB — TSH: TSH: 0.97 u[IU]/mL (ref 0.35–5.50)

## 2013-05-21 ENCOUNTER — Encounter: Payer: BC Managed Care – PPO | Admitting: Family Medicine

## 2013-05-25 ENCOUNTER — Other Ambulatory Visit: Payer: Self-pay | Admitting: Sports Medicine

## 2013-05-25 DIAGNOSIS — M25571 Pain in right ankle and joints of right foot: Secondary | ICD-10-CM

## 2013-05-29 ENCOUNTER — Encounter: Payer: Self-pay | Admitting: Family Medicine

## 2013-05-29 ENCOUNTER — Ambulatory Visit
Admission: RE | Admit: 2013-05-29 | Discharge: 2013-05-29 | Disposition: A | Discharge status: Home / Self Care | Payer: 59 | Source: Ambulatory Visit | Attending: Sports Medicine | Admitting: Sports Medicine

## 2013-05-29 ENCOUNTER — Ambulatory Visit (INDEPENDENT_AMBULATORY_CARE_PROVIDER_SITE_OTHER): Payer: 59 | Admitting: Family Medicine

## 2013-05-29 VITALS — BP 120/62 | HR 85 | Temp 98.2°F | Wt 218.0 lb

## 2013-05-29 DIAGNOSIS — Z23 Encounter for immunization: Secondary | ICD-10-CM

## 2013-05-29 DIAGNOSIS — Z0000 Encounter for general adult medical examination without abnormal findings: Secondary | ICD-10-CM

## 2013-05-29 DIAGNOSIS — R319 Hematuria, unspecified: Secondary | ICD-10-CM

## 2013-05-29 DIAGNOSIS — M25571 Pain in right ankle and joints of right foot: Secondary | ICD-10-CM

## 2013-05-29 LAB — POCT URINALYSIS DIPSTICK
Nitrite, UA: NEGATIVE
Protein, UA: NEGATIVE
Urobilinogen, UA: 0.2
pH, UA: 6.5

## 2013-05-29 NOTE — Addendum Note (Signed)
Addended by: Kern Reap B on: 05/29/2013 09:54 AM   Modules accepted: Orders

## 2013-05-29 NOTE — Progress Notes (Signed)
  Subjective:    Patient ID: Kristopher Holland, male    DOB: Jul 08, 1971, 42 y.o.   MRN: [id number]  HPI Patient seen for complete physical. He has history of dyslipidemia and has not been interested in taking statins previously. He continues exercise regularly. Nonsmoker.  History of GERD which is been controlled with over-the-counter Prilosec. No other prescription medications. Family history significant for father had lung cancer. Otherwise unrevealing. No history of premature CAD.  Past Medical History  Diagnosis Date  . HYPERLIPIDEMIA 01/07/2009  . ALLERGIC RHINITIS 01/07/2009  . GERD 01/07/2009  . LUMBAR STRAIN 01/29/2010   Past Surgical History  Procedure Laterality Date  . Tonsillectomy  1976    reports that he has never smoked. He does not have any smokeless tobacco history on file. He reports that he does not drink alcohol. His drug history is not on file. family history includes Cancer in his father, other, and paternal grandfather; Diabetes in his paternal grandfather. Allergies  Allergen Reactions  . Penicillins     REACTION: rash      Review of Systems  Constitutional: Negative for fever, activity change, appetite change and fatigue.  HENT: Negative for ear pain, congestion and trouble swallowing.   Eyes: Negative for pain and visual disturbance.  Respiratory: Negative for cough, shortness of breath and wheezing.   Cardiovascular: Negative for chest pain and palpitations.  Gastrointestinal: Negative for nausea, vomiting, abdominal pain, diarrhea, constipation, blood in stool, abdominal distention and rectal pain.  Genitourinary: Negative for dysuria, hematuria and testicular pain.  Musculoskeletal: Positive for arthralgias (Involving right hand PIP and DIP joints). Negative for joint swelling.  Skin: Negative for rash.  Neurological: Negative for dizziness, syncope and headaches.  Hematological: Negative for adenopathy.  Psychiatric/Behavioral: Negative for confusion  and dysphoric mood.       Objective:   Physical Exam  Constitutional: He is oriented to person, place, and time. He appears well-developed and well-nourished. No distress.  HENT:  Head: Normocephalic and atraumatic.  Right Ear: External ear normal.  Left Ear: External ear normal.  Mouth/Throat: Oropharynx is clear and moist.  Eyes: Conjunctivae and EOM are normal. Pupils are equal, round, and reactive to light.  Neck: Normal range of motion. Neck supple. No thyromegaly present.  Cardiovascular: Normal rate, regular rhythm and normal heart sounds.   No murmur heard. Pulmonary/Chest: No respiratory distress. He has no wheezes. He has no rales.  Abdominal: Soft. Bowel sounds are normal. He exhibits no distension and no mass. There is no tenderness. There is no rebound and no guarding.  Musculoskeletal: He exhibits no edema.  Lymphadenopathy:    He has no cervical adenopathy.  Neurological: He is alert and oriented to person, place, and time. He displays normal reflexes. No cranial nerve deficit.  Skin: No rash noted.  Psychiatric: He has a normal mood and affect.          Assessment & Plan:  Complete physical. Labs reviewed. He has made some improvements with lipids. Has prediabetes which is unchanged. Trace blood on urine dipstick. Check urine micro-. Flu vaccine given.

## 2013-05-29 NOTE — Addendum Note (Signed)
Addended by: Thomasena Edis on: 05/29/2013 10:45 AM   Modules accepted: Orders

## 2013-05-29 NOTE — Patient Instructions (Addendum)

## 2013-05-30 ENCOUNTER — Encounter: Payer: Self-pay | Admitting: Family Medicine

## 2013-05-30 LAB — URINALYSIS, MICROSCOPIC ONLY
Bacteria, UA: NONE SEEN
Casts: NONE SEEN

## 2013-08-29 ENCOUNTER — Other Ambulatory Visit: Payer: Self-pay | Admitting: Family Medicine

## 2014-01-31 ENCOUNTER — Other Ambulatory Visit: Payer: Self-pay | Admitting: Neurosurgery

## 2014-01-31 DIAGNOSIS — M549 Dorsalgia, unspecified: Secondary | ICD-10-CM

## 2014-02-01 ENCOUNTER — Ambulatory Visit
Admission: RE | Admit: 2014-02-01 | Discharge: 2014-02-01 | Disposition: A | Discharge status: Home / Self Care | Payer: 59 | Source: Ambulatory Visit | Attending: Neurosurgery | Admitting: Neurosurgery

## 2014-02-01 DIAGNOSIS — M549 Dorsalgia, unspecified: Secondary | ICD-10-CM

## 2014-02-01 MED ORDER — METHYLPREDNISOLONE ACETATE 40 MG/ML INJ SUSP (RADIOLOG
120.0000 mg | Freq: Once | INTRAMUSCULAR | Status: AC
  Administered 2014-02-01: 120 mg via EPIDURAL

## 2014-02-01 MED ORDER — IOHEXOL 180 MG/ML  SOLN
1.0000 mL | Freq: Once | INTRAMUSCULAR | Status: AC | PRN
  Administered 2014-02-01: 1 mL via EPIDURAL

## 2014-02-01 NOTE — Discharge Instructions (Signed)

## 2014-03-13 DIAGNOSIS — Z0271 Encounter for disability determination: Secondary | ICD-10-CM

## 2014-04-04 ENCOUNTER — Other Ambulatory Visit: Payer: Self-pay | Admitting: Neurosurgery

## 2014-04-04 DIAGNOSIS — M5126 Other intervertebral disc displacement, lumbar region: Secondary | ICD-10-CM

## 2014-04-08 ENCOUNTER — Ambulatory Visit
Admission: RE | Admit: 2014-04-08 | Discharge: 2014-04-08 | Disposition: A | Discharge status: Home / Self Care | Payer: 59 | Source: Ambulatory Visit | Attending: Neurosurgery | Admitting: Neurosurgery

## 2014-04-08 VITALS — BP 146/71 | HR 77

## 2014-04-08 DIAGNOSIS — M5126 Other intervertebral disc displacement, lumbar region: Secondary | ICD-10-CM

## 2014-04-08 MED ORDER — METHYLPREDNISOLONE ACETATE 40 MG/ML INJ SUSP (RADIOLOG
120.0000 mg | Freq: Once | INTRAMUSCULAR | Status: AC
  Administered 2014-04-08: 120 mg via EPIDURAL

## 2014-04-08 MED ORDER — IOHEXOL 180 MG/ML  SOLN
1.0000 mL | Freq: Once | INTRAMUSCULAR | Status: AC | PRN
  Administered 2014-04-08: 1 mL via EPIDURAL

## 2014-05-23 ENCOUNTER — Other Ambulatory Visit (INDEPENDENT_AMBULATORY_CARE_PROVIDER_SITE_OTHER): Payer: 59

## 2014-05-23 DIAGNOSIS — Z0000 Encounter for general adult medical examination without abnormal findings: Secondary | ICD-10-CM

## 2014-05-23 LAB — BASIC METABOLIC PANEL
BUN: 15 mg/dL (ref 6–23)
CHLORIDE: 101 meq/L (ref 96–112)
CO2: 29 meq/L (ref 19–32)
CREATININE: 1.1 mg/dL (ref 0.4–1.5)
Calcium: 9.5 mg/dL (ref 8.4–10.5)
GFR: 79.34 mL/min (ref >60.00)
Glucose, Bld: 173 mg/dL — ABNORMAL HIGH (ref 70–99)
POTASSIUM: 4.5 meq/L (ref 3.5–5.1)
Sodium: 137 mEq/L (ref 135–145)

## 2014-05-23 LAB — LIPID PANEL
CHOL/HDL RATIO: 9
Cholesterol: 271 mg/dL — ABNORMAL HIGH (ref 0–200)
HDL: 31.5 mg/dL — ABNORMAL LOW (ref >39.00)
LDL CALC: 200 mg/dL — AB (ref 0–99)
NONHDL: 239.5
Triglycerides: 200 mg/dL — ABNORMAL HIGH (ref 0.0–149.0)
VLDL: 40 mg/dL (ref 0.0–40.0)

## 2014-05-23 LAB — POCT URINALYSIS DIPSTICK
BILIRUBIN UA: NEGATIVE
Glucose, UA: NEGATIVE
KETONES UA: NEGATIVE
Leukocytes, UA: NEGATIVE
Nitrite, UA: NEGATIVE
PH UA: 7
PROTEIN UA: NEGATIVE
RBC UA: NEGATIVE
SPEC GRAV UA: 1.015
Urobilinogen, UA: 0.2

## 2014-05-23 LAB — HEPATIC FUNCTION PANEL
ALT: 30 U/L (ref 0–53)
AST: 22 U/L (ref 0–37)
Albumin: 4.3 g/dL (ref 3.5–5.2)
Alkaline Phosphatase: 101 U/L (ref 39–117)
BILIRUBIN DIRECT: 0.1 mg/dL (ref 0.0–0.3)
BILIRUBIN TOTAL: 0.4 mg/dL (ref 0.2–1.2)
TOTAL PROTEIN: 7.9 g/dL (ref 6.0–8.3)

## 2014-05-23 LAB — CBC WITH DIFFERENTIAL/PLATELET
BASOS ABS: 0 10*3/uL (ref 0.0–0.1)
BASOS PCT: 0.5 % (ref 0.0–3.0)
EOS ABS: 0.2 10*3/uL (ref 0.0–0.7)
Eosinophils Relative: 2.5 % (ref 0.0–5.0)
HCT: 42.8 % (ref 39.0–52.0)
HEMOGLOBIN: 14.2 g/dL (ref 13.0–17.0)
LYMPHS PCT: 28.9 % (ref 12.0–46.0)
Lymphs Abs: 2.1 10*3/uL (ref 0.7–4.0)
MCHC: 33.3 g/dL (ref 30.0–36.0)
MCV: 91.5 fl (ref 78.0–100.0)
MONOS PCT: 8 % (ref 3.0–12.0)
Monocytes Absolute: 0.6 10*3/uL (ref 0.1–1.0)
NEUTROS ABS: 4.3 10*3/uL (ref 1.4–7.7)
NEUTROS PCT: 60.1 % (ref 43.0–77.0)
Platelets: 231 10*3/uL (ref 150.0–400.0)
RBC: 4.67 Mil/uL (ref 4.22–5.81)
RDW: 14.8 % (ref 11.5–15.5)
WBC: 7.1 10*3/uL (ref 4.0–10.5)

## 2014-05-23 LAB — TSH: TSH: 1.02 u[IU]/mL (ref 0.35–4.50)

## 2014-05-30 ENCOUNTER — Ambulatory Visit (INDEPENDENT_AMBULATORY_CARE_PROVIDER_SITE_OTHER): Payer: 59 | Admitting: Family Medicine

## 2014-05-30 ENCOUNTER — Encounter: Payer: Self-pay | Admitting: Family Medicine

## 2014-05-30 VITALS — BP 124/78 | HR 88 | Temp 98.2°F | Ht 68.0 in | Wt 218.0 lb

## 2014-05-30 DIAGNOSIS — E785 Hyperlipidemia, unspecified: Secondary | ICD-10-CM

## 2014-05-30 DIAGNOSIS — E784 Other hyperlipidemia: Secondary | ICD-10-CM

## 2014-05-30 DIAGNOSIS — Z0000 Encounter for general adult medical examination without abnormal findings: Secondary | ICD-10-CM

## 2014-05-30 DIAGNOSIS — R739 Hyperglycemia, unspecified: Secondary | ICD-10-CM

## 2014-05-30 DIAGNOSIS — Z23 Encounter for immunization: Secondary | ICD-10-CM

## 2014-05-30 DIAGNOSIS — E669 Obesity, unspecified: Secondary | ICD-10-CM | POA: Insufficient documentation

## 2014-05-30 LAB — HEMOGLOBIN A1C: Hgb A1c MFr Bld: 7.6 % — ABNORMAL HIGH (ref 4.6–6.5)

## 2014-05-30 MED ORDER — COLESEVELAM HCL 3.75 G PO PACK: 1.0000 | PACK | Freq: Every day | ORAL | Status: DC

## 2014-05-30 MED ORDER — PROMETHAZINE HCL 25 MG PO TABS: 25.0000 mg | ORAL_TABLET | Freq: Four times a day (QID) | ORAL | Status: DC | PRN

## 2014-05-30 NOTE — Patient Instructions (Signed)
Continue regular exercise habits Try to lose some weight Reduce sugar and starch intake. Start WelChol 3.75 g once daily We will plan repeat lipid and A1c in about 12 weeks

## 2014-05-30 NOTE — Progress Notes (Signed)
   Subjective:    Patient ID: Kristopher Holland, male    DOB: Aug 02, 1971, 43 y.o.   MRN: [id number]  HPI Patient seen for complete physical. He has history of GERD, dyslipidemia, prediabetes. He's been checking some fasting blood sugars recently and obtained several readings around 140-150 fasting. No symptoms of polydipsia or polyuria. Still exercises about 4 days per week and combines weight lifting with cardio. Long history of dyslipidemia. He has had intolerance to multiple statins. Nonsmoker. No recent chest pains. Tetanus up-to-date.  Has been followed by orthopedics for some ongoing back difficulties.  Past Medical History  Diagnosis Date  . HYPERLIPIDEMIA 01/07/2009  . ALLERGIC RHINITIS 01/07/2009  . GERD 01/07/2009  . LUMBAR STRAIN 01/29/2010   Past Surgical History  Procedure Laterality Date  . Tonsillectomy  1976    reports that he has never smoked. He does not have any smokeless tobacco history on file. He reports that he does not drink alcohol. His drug history is not on file. family history includes Cancer in his father, other, and paternal grandfather; Diabetes in his paternal grandfather. Allergies  Allergen Reactions  . Penicillins Rash      Review of Systems  Constitutional: Negative for fever, activity change, appetite change and fatigue.  HENT: Negative for congestion, ear pain and trouble swallowing.   Eyes: Negative for pain and visual disturbance.  Respiratory: Negative for cough, shortness of breath and wheezing.   Cardiovascular: Negative for chest pain and palpitations.  Gastrointestinal: Negative for nausea, vomiting, abdominal pain, diarrhea, constipation, blood in stool, abdominal distention and rectal pain.  Genitourinary: Negative for dysuria, hematuria and testicular pain.  Musculoskeletal: Positive for back pain. Negative for arthralgias and joint swelling.  Skin: Negative for rash.  Neurological: Negative for dizziness, syncope and headaches.    Hematological: Negative for adenopathy.  Psychiatric/Behavioral: Negative for confusion and dysphoric mood.       Objective:   Physical Exam  Constitutional: He is oriented to person, place, and time. He appears well-developed and well-nourished. No distress.  HENT:  Head: Normocephalic and atraumatic.  Right Ear: External ear normal.  Left Ear: External ear normal.  Mouth/Throat: Oropharynx is clear and moist.  Eyes: Conjunctivae and EOM are normal. Pupils are equal, round, and reactive to light.  Neck: Normal range of motion. Neck supple. No thyromegaly present.  Cardiovascular: Normal rate, regular rhythm and normal heart sounds.   No murmur heard. Pulmonary/Chest: No respiratory distress. He has no wheezes. He has no rales.  Abdominal: Soft. Bowel sounds are normal. He exhibits no distension and no mass. There is no tenderness. There is no rebound and no guarding.  Musculoskeletal: He exhibits no edema.  Lymphadenopathy:    He has no cervical adenopathy.  Neurological: He is alert and oriented to person, place, and time. He displays normal reflexes. No cranial nerve deficit.  Skin: No rash noted.  Psychiatric: He has a normal mood and affect.          Assessment & Plan:  Complete physical. Labs reviewed. He has significant dyslipidemia along with glucose  173. Obtain A1c. He has had intolerance to multiple statins in the past. We've advised weight loss and reduce sugar and starch intake. Start WelChol 3.75 g once daily which hopefully will help with blood sugars and lipids since he cannot take statins. Reassess A1c along with lipid panel in 12 weeks

## 2014-05-30 NOTE — Progress Notes (Signed)
Pre visit review using our clinic review tool, if applicable. No additional management support is needed unless otherwise documented below in the visit note. 

## 2014-05-31 ENCOUNTER — Encounter: Payer: Self-pay | Admitting: Family Medicine

## 2014-05-31 ENCOUNTER — Other Ambulatory Visit: Payer: Self-pay | Admitting: Neurosurgery

## 2014-05-31 DIAGNOSIS — M5126 Other intervertebral disc displacement, lumbar region: Secondary | ICD-10-CM

## 2014-06-03 ENCOUNTER — Ambulatory Visit
Admission: RE | Admit: 2014-06-03 | Discharge: 2014-06-03 | Disposition: A | Discharge status: Home / Self Care | Payer: 59 | Source: Ambulatory Visit | Attending: Neurosurgery | Admitting: Neurosurgery

## 2014-06-03 DIAGNOSIS — M5126 Other intervertebral disc displacement, lumbar region: Secondary | ICD-10-CM

## 2014-06-13 ENCOUNTER — Other Ambulatory Visit: Payer: Self-pay | Admitting: Neurosurgery

## 2014-06-13 DIAGNOSIS — M5126 Other intervertebral disc displacement, lumbar region: Secondary | ICD-10-CM

## 2014-06-17 ENCOUNTER — Ambulatory Visit
Admission: RE | Admit: 2014-06-17 | Discharge: 2014-06-17 | Disposition: A | Discharge status: Home / Self Care | Payer: 59 | Source: Ambulatory Visit | Attending: Neurosurgery | Admitting: Neurosurgery

## 2014-06-17 VITALS — BP 151/87 | HR 70

## 2014-06-17 DIAGNOSIS — M5126 Other intervertebral disc displacement, lumbar region: Secondary | ICD-10-CM

## 2014-06-17 MED ORDER — IOHEXOL 180 MG/ML  SOLN
1.0000 mL | Freq: Once | INTRAMUSCULAR | Status: AC | PRN
Start: 2014-06-17 — End: 2014-06-17
  Administered 2014-06-17: 1 mL via EPIDURAL

## 2014-06-17 MED ORDER — METHYLPREDNISOLONE ACETATE 40 MG/ML INJ SUSP (RADIOLOG
120.0000 mg | Freq: Once | INTRAMUSCULAR | Status: AC
  Administered 2014-06-17: 120 mg via EPIDURAL

## 2014-09-02 ENCOUNTER — Other Ambulatory Visit: Payer: Self-pay | Admitting: Family Medicine

## 2014-09-22 ENCOUNTER — Encounter (HOSPITAL_COMMUNITY): Payer: Self-pay | Admitting: *Deleted

## 2014-09-22 ENCOUNTER — Emergency Department (HOSPITAL_COMMUNITY)
Admission: EM | Admit: 2014-09-22 | Discharge: 2014-09-22 | Disposition: A | Discharge status: Home / Self Care | Payer: 59 | Attending: Emergency Medicine | Admitting: Emergency Medicine

## 2014-09-22 DIAGNOSIS — Z880 Allergy status to penicillin: Secondary | ICD-10-CM | POA: Diagnosis not present

## 2014-09-22 DIAGNOSIS — E785 Hyperlipidemia, unspecified: Secondary | ICD-10-CM | POA: Insufficient documentation

## 2014-09-22 DIAGNOSIS — T380X5A Adverse effect of glucocorticoids and synthetic analogues, initial encounter: Secondary | ICD-10-CM | POA: Diagnosis not present

## 2014-09-22 DIAGNOSIS — Z8709 Personal history of other diseases of the respiratory system: Secondary | ICD-10-CM | POA: Insufficient documentation

## 2014-09-22 DIAGNOSIS — R739 Hyperglycemia, unspecified: Secondary | ICD-10-CM

## 2014-09-22 DIAGNOSIS — K219 Gastro-esophageal reflux disease without esophagitis: Secondary | ICD-10-CM | POA: Insufficient documentation

## 2014-09-22 DIAGNOSIS — Z79899 Other long term (current) drug therapy: Secondary | ICD-10-CM | POA: Diagnosis not present

## 2014-09-22 DIAGNOSIS — E0965 Drug or chemical induced diabetes mellitus with hyperglycemia: Secondary | ICD-10-CM | POA: Diagnosis not present

## 2014-09-22 DIAGNOSIS — E1165 Type 2 diabetes mellitus with hyperglycemia: Secondary | ICD-10-CM | POA: Diagnosis present

## 2014-09-22 HISTORY — DX: Type 2 diabetes mellitus without complications: E11.9

## 2014-09-22 LAB — COMPREHENSIVE METABOLIC PANEL
ALT: 37 U/L (ref 0–53)
AST: 31 U/L (ref 0–37)
Albumin: 4.2 g/dL (ref 3.5–5.2)
Alkaline Phosphatase: 114 U/L (ref 39–117)
Anion gap: 7 (ref 5–15)
BUN: 19 mg/dL (ref 6–23)
CO2: 34 mmol/L — ABNORMAL HIGH (ref 19–32)
Calcium: 9.7 mg/dL (ref 8.4–10.5)
Chloride: 90 mmol/L — ABNORMAL LOW (ref 96–112)
Creatinine, Ser: 1.02 mg/dL (ref 0.50–1.35)
GFR calc Af Amer: >90 mL/min (ref >90)
GFR calc non Af Amer: 87 mL/min — ABNORMAL LOW (ref >90)
Glucose, Bld: 484 mg/dL — ABNORMAL HIGH (ref 70–99)
Potassium: 4.5 mmol/L (ref 3.5–5.1)
Sodium: 131 mmol/L — ABNORMAL LOW (ref 135–145)
Total Bilirubin: 0.7 mg/dL (ref 0.3–1.2)
Total Protein: >12 g/dL — ABNORMAL HIGH (ref 6.0–8.3)

## 2014-09-22 LAB — URINALYSIS, ROUTINE W REFLEX MICROSCOPIC
Bilirubin Urine: NEGATIVE
Glucose, UA: >1000 mg/dL — AB
Hgb urine dipstick: NEGATIVE
Ketones, ur: 15 mg/dL — AB
Leukocytes, UA: NEGATIVE
Nitrite: NEGATIVE
Protein, ur: NEGATIVE mg/dL
Specific Gravity, Urine: 1.043 — ABNORMAL HIGH (ref 1.005–1.030)
Urobilinogen, UA: 1 mg/dL (ref 0.0–1.0)
pH: 6.5 (ref 5.0–8.0)

## 2014-09-22 LAB — CBG MONITORING, ED
Glucose-Capillary: 388 mg/dL — ABNORMAL HIGH (ref 70–99)
Glucose-Capillary: 313 mg/dL — ABNORMAL HIGH (ref 70–99)
Glucose-Capillary: 267 mg/dL — ABNORMAL HIGH (ref 70–99)

## 2014-09-22 LAB — URINE MICROSCOPIC-ADD ON

## 2014-09-22 LAB — CBC
HCT: 41.4 % (ref 39.0–52.0)
Hemoglobin: 15.2 g/dL (ref 13.0–17.0)
MCH: 31.3 pg (ref 26.0–34.0)
MCHC: 36.7 g/dL — ABNORMAL HIGH (ref 30.0–36.0)
MCV: 85.4 fL (ref 78.0–100.0)
Platelets: 240 10*3/uL (ref 150–400)
RBC: 4.85 MIL/uL (ref 4.22–5.81)
RDW: 12.7 % (ref 11.5–15.5)
WBC: 8.5 10*3/uL (ref 4.0–10.5)

## 2014-09-22 MED ORDER — INSULIN ASPART 100 UNIT/ML ~~LOC~~ SOLN
8.0000 [IU] | Freq: Once | SUBCUTANEOUS | Status: AC
  Administered 2014-09-22: 8 [IU] via INTRAVENOUS
  Filled 2014-09-22: qty 1

## 2014-09-22 MED ORDER — SODIUM CHLORIDE 0.9 % IV BOLUS (SEPSIS)
2000.0000 mL | Freq: Once | INTRAVENOUS | Status: AC
  Administered 2014-09-22: 1000 mL via INTRAVENOUS

## 2014-09-22 NOTE — ED Notes (Signed)
Pt reports back surgery back in December, has been started on several medications including prednisone and now having high blood sugars. Reports frequent thirst and fatigue, cbg >500 at home.

## 2014-09-22 NOTE — ED Provider Notes (Signed)
CSN: [id number]     Arrival date & time 09/22/14  1635 History   First MD Initiated Contact with Patient 09/22/14 1851     Chief Complaint  Patient presents with  . Hyperglycemia     (Consider location/radiation/quality/duration/timing/severity/associated sxs/prior Treatment) HPI   43yM with hyperglycemia. CBG >500 at home. Polyuria, polydipsia, fatigue. Back surgery in December and on second course of steroids now for continued pain. Most recently, has been on for past week. "I don't have diabetes." Not on meds. No fever or chills. Mild nausea. No vomiting.   Past Medical History  Diagnosis Date  . HYPERLIPIDEMIA 01/07/2009  . ALLERGIC RHINITIS 01/07/2009  . GERD 01/07/2009  . LUMBAR STRAIN 01/29/2010  . Diabetes mellitus without complication    Past Surgical History  Procedure Laterality Date  . Tonsillectomy  1976   Family History  Problem Relation Age of Onset  . Cancer Father     lung  . Cancer Paternal Grandfather     lung  . Diabetes Paternal Grandfather   . Cancer Other     bone, grandfather   History  Substance Use Topics  . Smoking status: Never Smoker   . Smokeless tobacco: Not on file  . Alcohol Use: No    Review of Systems All systems reviewed and negative, other than as noted in HPI.   Allergies  Penicillins  Home Medications   Prior to Admission medications   Medication Sig Start Date End Date Taking? Authorizing Provider  Colesevelam HCl 3.75 G PACK Take 1 packet by mouth daily. 05/30/14  Yes Kristian CoveyBruce W Burchette, MD  fish oil-omega-3 fatty acids 1000 MG capsule Take 1 g by mouth daily.    Yes Historical Provider, MD  gabapentin (NEURONTIN) 300 MG capsule Take 600 mg by mouth 3 (three) times daily as needed (pain).  09/09/14  Yes Historical Provider, MD  HYDROmorphone (DILAUDID) 4 MG tablet Take 4 mg by mouth every 4 (four) hours as needed for severe pain.  08/26/14  Yes Historical Provider, MD  ibuprofen (ADVIL,MOTRIN) 200 MG tablet Take 800 mg by  mouth every 6 (six) hours as needed (pain).   Yes Historical Provider, MD  Multiple Vitamins-Minerals (CENTRUM ULTRA MENS) TABS Take 1 tablet by mouth daily.    Yes Historical Provider, MD  omeprazole (PRILOSEC) 20 MG capsule Take 20 mg by mouth daily.     Yes Historical Provider, MD  promethazine (PHENERGAN) 25 MG tablet TAKE 1 TABLET (25 MG TOTAL) BY MOUTH EVERY 6 (SIX) HOURS AS NEEDED FOR NAUSEA OR VOMITING. 09/02/14  Yes Kristian CoveyBruce W Burchette, MD   BP 132/76 mmHg  Pulse 77  Temp(Src) 97.9 F (36.6 C) (Oral)  Resp 15  Ht 5\' 8"  (1.727 m)  Wt 197 lb (89.359 kg)  BMI 29.96 kg/m2  SpO2 95% Physical Exam  Constitutional: He appears well-developed and well-nourished. No distress.  HENT:  Head: Normocephalic and atraumatic.  Eyes: Conjunctivae are normal. Right eye exhibits no discharge. Left eye exhibits no discharge.  Neck: Neck supple.  Cardiovascular: Normal rate, regular rhythm and normal heart sounds.  Exam reveals no gallop and no friction rub.   No murmur heard. Pulmonary/Chest: Effort normal and breath sounds normal. No respiratory distress.  Abdominal: Soft. He exhibits no distension. There is no tenderness.  Musculoskeletal: He exhibits no edema or tenderness.  Neurological: He is alert.  Skin: Skin is warm and dry.  Psychiatric: He has a normal mood and affect. His behavior is normal. Thought content normal.  Nursing  note and vitals reviewed.   ED Course  Procedures (including critical care time) Labs Review Labs Reviewed  CBC - Abnormal; Notable for the following:    MCHC 36.7 (*)    All other components within normal limits  COMPREHENSIVE METABOLIC PANEL - Abnormal; Notable for the following:    Sodium 131 (*)    Chloride 90 (*)    CO2 34 (*)    Glucose, Bld 484 (*)    Total Protein >12.0 (*)    GFR calc non Af Amer 87 (*)    All other components within normal limits  URINALYSIS, ROUTINE W REFLEX MICROSCOPIC - Abnormal; Notable for the following:    Specific  Gravity, Urine 1.043 (*)    Glucose, UA >1000 (*)    Ketones, ur 15 (*)    All other components within normal limits  CBG MONITORING, ED - Abnormal; Notable for the following:    Glucose-Capillary 388 (*)    All other components within normal limits  CBG MONITORING, ED - Abnormal; Notable for the following:    Glucose-Capillary 313 (*)    All other components within normal limits  CBG MONITORING, ED - Abnormal; Notable for the following:    Glucose-Capillary 267 (*)    All other components within normal limits  URINE MICROSCOPIC-ADD ON    Imaging Review No results found.   EKG Interpretation None      MDM   Final diagnoses:  Steroid-induced hyperglycemia    43yM with significant hyperglycemia. Symptomatic. Most likely related to recent steroids use. Most recently, has been on for ~ last week. Shouldn't need to taper. Will have stop. IVF and some insulin in ED. Expect to further correct with cessation for steroids. Return precautions discussed. Outpt FU.    Raeford Razor, MD 09/30/14 [phone]

## 2014-09-22 NOTE — Discharge Instructions (Signed)

## 2014-09-23 ENCOUNTER — Encounter: Payer: Self-pay | Admitting: Family Medicine

## 2014-09-23 ENCOUNTER — Ambulatory Visit (INDEPENDENT_AMBULATORY_CARE_PROVIDER_SITE_OTHER): Payer: 59 | Admitting: Family Medicine

## 2014-09-23 ENCOUNTER — Telehealth: Payer: Self-pay | Admitting: Family Medicine

## 2014-09-23 VITALS — BP 121/65 | HR 66 | Temp 98.3°F | Wt 206.0 lb

## 2014-09-23 DIAGNOSIS — E119 Type 2 diabetes mellitus without complications: Secondary | ICD-10-CM

## 2014-09-23 LAB — CBG MONITORING, ED: Glucose-Capillary: 488 mg/dL — ABNORMAL HIGH (ref 70–99)

## 2014-09-23 MED ORDER — GLIPIZIDE 5 MG PO TABS: 5.0000 mg | ORAL_TABLET | Freq: Two times a day (BID) | ORAL | Status: DC

## 2014-09-23 NOTE — Telephone Encounter (Signed)
FYI: Patient saw Dr. Clent RidgesFry 09/23/14 and will come back 09/24/14 to see Dr. BLeonard Schwartz

## 2014-09-23 NOTE — Progress Notes (Signed)
   Subjective:    Patient ID: Kristopher Holland, male    DOB: February 18, 1971, 44 y.o.   MRN: [id number]  HPI Here with his wife to follow up on high blood sugars and other symptoms like dizziness and blurred vision. No HA or chest pain or SOB. He was diagnosed with mild type 2 diabetes last fall and his A1c in October was 7.6. He had been working with Dr. Caryl NeverBurchette to get this down with diet and Welchol, and his glucoses had been in the low 100s until recently. He had lumbar spine surgery per Dr. Jeral FruitBotero on 08-20-14, but he has continued to have pain down the right leg since then. He has not been able to exercise at all, and he has not followed a very strict diet. He was given a 6 day taper of prednisone in early January and then was given another taper starting 6 days ago. This second taper started at a total of 60 mg a day. He started to feel bad and have blurred vision 3 days ago and it got worse yesterday. He went to the ED yesterday and his random glucose was 484. Other labs were okay and his exam as non-focal. He was told to stop the prednisone, which he has done, and to follow up with us. Today he feels a little better but his glucoses have remained in the range of 414 to 448.    Review of Systems  Eyes: Positive for visual disturbance.  Respiratory: Negative.   Cardiovascular: Negative.   Neurological: Positive for weakness. Negative for tremors, seizures, syncope, facial asymmetry, speech difficulty, light-headedness, numbness and headaches.       Objective:   Physical Exam  Constitutional: He is oriented to person, place, and time. He appears well-developed and well-nourished. No distress.  Cardiovascular: Normal rate, regular rhythm, normal heart sounds and intact distal pulses.   Pulmonary/Chest: Effort normal and breath sounds normal.  Neurological: He is alert and oriented to person, place, and time.          Assessment & Plan:  I think all his sx are from the high glucoses. We  will start him on Glipizide 5 mg bid for awhile, but I would anticipate that he could come off this in a week or two. He will reduce his carb intake and he will drink lots of water. I reassured him that his vision will return to baseline over the next few weeks. He will check his glucoses frequently at home. I asked him to stop the Glipizide once the glucoses are in the 100 to 200 range. He will follow up with Dr. Caryl NeverBurchette in one week.

## 2014-09-23 NOTE — Telephone Encounter (Signed)
Patient Name: Nathaneil CanaryBRADY Creveling DOB: [birth year] Initial Comment caller states husband is on prednisone and BS is 414, blurry vision, dizzy and light headed...[facility] Nurse Assessment Nurse: Charna Elizabethrumbull, RN, Cathy Date/Time (Eastern Time): 09/23/2014 2:46:13 PM Confirm and document reason for call. If symptomatic, describe symptoms. ---Caller states her husband's blood sugar was 414 about 1:30pm. He is feeling dizzy and has blurred vision. Has the patient traveled out of the country within the last 30 days? ---No Does the patient require triage? ---Yes Related visit to physician within the last 2 weeks? ---Yes Does the PT have any chronic conditions? (i.e. diabetes, asthma, etc.) ---Yes List chronic conditions. ---Back surgery 08/10/14 (took Prednisone), High Cholesterol Guidelines Guideline Title Affirmed Question Affirmed Notes Diabetes - High Blood Sugar Blood glucose > 400 mg/dl (22 mmol/l) Final Disposition User Call PCP Now Charna Elizabethrumbull, RN, Cathy Comments Christy states her husband was seen in the ER who felt his high Blood Sugar was related to previous Prednisone and was directed to follow up with MD. Scheduled for 4:15pm appointment. Encouraged to call back as needed.

## 2014-09-24 ENCOUNTER — Ambulatory Visit: Payer: 59 | Admitting: Family Medicine

## 2014-09-25 ENCOUNTER — Other Ambulatory Visit: Payer: Self-pay | Admitting: Neurosurgery

## 2014-09-25 DIAGNOSIS — M5126 Other intervertebral disc displacement, lumbar region: Secondary | ICD-10-CM

## 2014-09-30 ENCOUNTER — Encounter: Payer: Self-pay | Admitting: Family Medicine

## 2014-09-30 ENCOUNTER — Ambulatory Visit (INDEPENDENT_AMBULATORY_CARE_PROVIDER_SITE_OTHER): Payer: 59 | Admitting: Family Medicine

## 2014-09-30 VITALS — BP 128/70 | HR 79 | Temp 97.8°F | Wt 208.0 lb

## 2014-09-30 DIAGNOSIS — E119 Type 2 diabetes mellitus without complications: Secondary | ICD-10-CM

## 2014-09-30 NOTE — Patient Instructions (Signed)
Continue to monitor blood sugars.  If sugars are consistently > 200 stay on Glipizide twice daily If sugars are consistently < 160 discontinue the Glipizide. Let's plan repeat A1c in about 2 months.

## 2014-09-30 NOTE — Progress Notes (Signed)
Pre visit review using our clinic review tool, if applicable. No additional management support is needed unless otherwise documented below in the visit note. 

## 2014-09-30 NOTE — Progress Notes (Signed)
Subjective:    Patient ID: Kristopher Holland, male    DOB: [birth year], 44 y.o.   MRN: [id number]  HPI Patient here for follow-up regarding hyperglycemia. He's been having some back difficulties and had surgery December 22. He's been treated with multiple medications including tramadol and gabapentin and hydromorphone per neurosurgeon. He had ongoing pain and was prescribed oral prednisone at 60 mg daily. He developed symptoms of polyuria, polydipsia, and weight loss. He went to emergency department over one week ago and had blood sugars over 600. He has known history of type 2 diabetes which is been managed with WelChol and lifestyle management. Prior to the prednisone, he was consistently getting blood sugars around 130 or less fasting and none of the above symptoms. After starting the prednisone, he also developed some blurred vision which is slowly improving. He was given insulin in the emergency department and seen here for follow-up in 1 week ago prescribed glipizide 5 mg twice a day.  Symptoms are dramatically improved this time. He still some blurred vision but weight has stabilized and he has no polyuria or polydipsia. Last A1c 7.6%. Fasting blood sugar this morning and 160.  He is having ongoing back difficulties and some numbness in the right foot and plans to see neurosurgeon for follow-up MRI soon. Denies any recent infectious problems or fever. He has now been off the prednisone for approximately one-week  Past Medical History  Diagnosis Date  . HYPERLIPIDEMIA 01/07/2009  . ALLERGIC RHINITIS 01/07/2009  . GERD 01/07/2009  . LUMBAR STRAIN 01/29/2010  . Diabetes mellitus without complication    Past Surgical History  Procedure Laterality Date  . Tonsillectomy  1976    reports that he has never smoked. He does not have any smokeless tobacco history on file. He reports that he does not drink alcohol. His drug history is not on file. family history includes Cancer in his father, other,  and paternal grandfather; Diabetes in his paternal grandfather. Allergies  Allergen Reactions  . Penicillins Rash      Review of Systems  Constitutional: Negative for fatigue.  Eyes: Negative for visual disturbance.  Respiratory: Negative for cough, chest tightness and shortness of breath.   Cardiovascular: Negative for chest pain, palpitations and leg swelling.  Endocrine: Negative for polydipsia and polyuria.  Musculoskeletal: Positive for back pain.  Neurological: Negative for dizziness, syncope, weakness, light-headedness and headaches.       Objective:   Physical Exam  Constitutional: He appears well-developed and well-nourished.  Neck: Neck supple. No thyromegaly present.  Cardiovascular: Normal rate and regular rhythm.  Exam reveals no gallop.   No murmur heard. Pulmonary/Chest: Effort normal and breath sounds normal. No respiratory distress. He has no wheezes. He has no rales.  Musculoskeletal: He exhibits no edema.  Neurological: He is alert.          Assessment & Plan:  Type 2 diabetes. Recent poor control secondary to prednisone. This is improving as prednisone is clearing out of his system. We've recommended if fasting blood sugars over 200 to remain on glipizide 5 mg twice daily. Try reducing this to once daily for blood sugars fasting 160-200. He will remain on WelChol. Repeat A1c in 2 months. We discussed use of metformin if blood sugars consistently over 130 over the next several weeks as he comes off the prednisone. We explained that our preference for metformin over sulfonylurea. 30 minutes were spent with patient regarding counseling and evaluation in direct care and direct time with patient

## 2014-10-02 ENCOUNTER — Ambulatory Visit
Admission: RE | Admit: 2014-10-02 | Discharge: 2014-10-02 | Disposition: A | Discharge status: Home / Self Care | Payer: 59 | Source: Ambulatory Visit | Attending: Neurosurgery | Admitting: Neurosurgery

## 2014-10-02 DIAGNOSIS — M5126 Other intervertebral disc displacement, lumbar region: Secondary | ICD-10-CM

## 2014-10-02 MED ORDER — GADOBENATE DIMEGLUMINE 529 MG/ML IV SOLN
19.0000 mL | Freq: Once | INTRAVENOUS | Status: AC | PRN
  Administered 2014-10-02: 19 mL via INTRAVENOUS

## 2014-10-07 ENCOUNTER — Other Ambulatory Visit: Payer: Self-pay | Admitting: Neurosurgery

## 2014-10-07 DIAGNOSIS — M5416 Radiculopathy, lumbar region: Secondary | ICD-10-CM

## 2014-10-07 DIAGNOSIS — IMO0001 Reserved for inherently not codable concepts without codable children: Secondary | ICD-10-CM

## 2014-10-07 DIAGNOSIS — T814XXA Infection following a procedure, initial encounter: Secondary | ICD-10-CM

## 2014-10-14 ENCOUNTER — Inpatient Hospital Stay: Admission: RE | Admit: 2014-10-14 | Payer: 59 | Source: Ambulatory Visit

## 2014-10-15 ENCOUNTER — Other Ambulatory Visit: Payer: Self-pay | Admitting: Neurosurgery

## 2014-10-15 ENCOUNTER — Ambulatory Visit
Admission: RE | Admit: 2014-10-15 | Discharge: 2014-10-15 | Disposition: A | Discharge status: Home / Self Care | Payer: 59 | Source: Ambulatory Visit | Attending: Neurosurgery | Admitting: Neurosurgery

## 2014-10-15 DIAGNOSIS — M5416 Radiculopathy, lumbar region: Secondary | ICD-10-CM

## 2014-10-15 DIAGNOSIS — T814XXA Infection following a procedure, initial encounter: Secondary | ICD-10-CM

## 2014-10-15 DIAGNOSIS — IMO0001 Reserved for inherently not codable concepts without codable children: Secondary | ICD-10-CM

## 2014-10-15 MED ORDER — FENTANYL CITRATE 0.05 MG/ML IJ SOLN
25.0000 ug | INTRAMUSCULAR | Status: DC | PRN
  Administered 2014-10-15: 50 ug via INTRAVENOUS

## 2014-10-15 MED ORDER — SODIUM CHLORIDE 0.9 % IV SOLN
Freq: Once | INTRAVENOUS | Status: AC
  Administered 2014-10-15: 12:00:00 via INTRAVENOUS

## 2014-10-15 MED ORDER — MIDAZOLAM HCL 2 MG/2ML IJ SOLN
1.0000 mg | INTRAMUSCULAR | Status: DC | PRN
  Administered 2014-10-15: 1 mg via INTRAVENOUS

## 2014-10-15 MED ORDER — VANCOMYCIN HCL 1000 MG IV SOLR
1000.0000 mg | INTRAVENOUS | Status: AC
  Administered 2014-10-15: 1000 mg

## 2014-10-15 MED ORDER — IOHEXOL 180 MG/ML  SOLN
3.0000 mL | Freq: Once | INTRAMUSCULAR | Status: AC | PRN
  Administered 2014-10-15: 3 mL

## 2014-10-15 MED ORDER — KETOROLAC TROMETHAMINE 30 MG/ML IJ SOLN
30.0000 mg | Freq: Once | INTRAMUSCULAR | Status: AC
  Administered 2014-10-15: 30 mg via INTRAVENOUS

## 2014-10-15 NOTE — Progress Notes (Signed)
Dr. Carlota Holland in to speak with patient and his wife in recovery area after disc aspiration.  Kristopher SievertJeanne Lohr, RN

## 2014-10-15 NOTE — Progress Notes (Signed)
Sedation time 10 minutes for disc aspiration.  Donell SievertJeanne Lohr, RN

## 2014-10-15 NOTE — Discharge Instructions (Signed)
Disc Aspiration Post Procedure Discharge Instructions ° °1. May resume a regular diet and any medications that you routinely take (including pain medications). °2. No driving day of procedure. °3. Upon discharge go home and rest for at least 4 hours.  May use an ice pack as needed to injection sites on back. °4. Remove bandades later, today. ° ° ° °Please contact our office at [phone] for the following symptoms: ° °· Fever greater than 100 degrees °· Increased swelling, pain, or redness at injection site. ° ° °Thank you for visiting [facility]. ° °  ° ° ° °    °

## 2014-10-18 LAB — BODY FLUID CULTURE
Gram Stain: NONE SEEN
Organism ID, Bacteria: NO GROWTH

## 2014-12-08 ENCOUNTER — Encounter: Payer: Self-pay | Admitting: Family Medicine

## 2014-12-09 ENCOUNTER — Other Ambulatory Visit: Payer: Self-pay

## 2014-12-09 MED ORDER — ONETOUCH ULTRA BLUE VI STRP: ORAL_STRIP | Status: DC

## 2014-12-30 ENCOUNTER — Encounter: Payer: Self-pay | Admitting: Family Medicine

## 2014-12-30 ENCOUNTER — Ambulatory Visit (INDEPENDENT_AMBULATORY_CARE_PROVIDER_SITE_OTHER): Payer: 59 | Admitting: Family Medicine

## 2014-12-30 VITALS — BP 128/80 | HR 88 | Temp 98.0°F | Wt 209.0 lb

## 2014-12-30 DIAGNOSIS — E119 Type 2 diabetes mellitus without complications: Secondary | ICD-10-CM | POA: Diagnosis not present

## 2014-12-30 MED ORDER — METFORMIN HCL 500 MG PO TABS: 500.0000 mg | ORAL_TABLET | Freq: Two times a day (BID) | ORAL | Status: DC

## 2014-12-30 NOTE — Progress Notes (Signed)
Pre visit review using our clinic review tool, if applicable. No additional management support is needed unless otherwise documented below in the visit note. 

## 2014-12-30 NOTE — Patient Instructions (Signed)
Start the Metformin and after 2 weeks, if fasting sugars not consistently < 130, go ahead and add one Glipizide per day.

## 2014-12-30 NOTE — Progress Notes (Signed)
   Subjective:    Patient ID: Kristopher Holland, male    DOB: September 28, 1970, 44 y.o.   MRN: [id number]  HPI Patient seen for follow-up diabetes. Refer to previous notes. He presented here with very high blood sugars (after being on prednisone) and was placed initially on glipizide. His blood sugars are improving after he came off prednisone but he had recent fasting blood sugar 274. He has not been on any prednisone recently. He is currently not taking glipizide or any diabetic medications. Had some fatigue issues. Occasional polyuria and polydipsia. No visual changes. Exercise has been limited. He has upcoming repeat back surgery 24th of this month  Past Medical History  Diagnosis Date  . HYPERLIPIDEMIA 01/07/2009  . ALLERGIC RHINITIS 01/07/2009  . GERD 01/07/2009  . LUMBAR STRAIN 01/29/2010  . Diabetes mellitus without complication    Past Surgical History  Procedure Laterality Date  . Tonsillectomy  1976    reports that he has never smoked. He does not have any smokeless tobacco history on file. He reports that he does not drink alcohol. His drug history is not on file. family history includes Cancer in his father, other, and paternal grandfather; Diabetes in his paternal grandfather. Allergies  Allergen Reactions  . Prednisone Other (See Comments)    Made blood sugar levels "go into the 600's."  Patient is not diabetic.  Marland Kitchen. Penicillins Rash      Review of Systems  Constitutional: Positive for fatigue. Negative for fever and chills.  Endocrine: Positive for polydipsia and polyuria.       Objective:   Physical Exam  Constitutional: He appears well-developed and well-nourished.  Cardiovascular: Normal rate and regular rhythm.   Pulmonary/Chest: Effort normal and breath sounds normal. No respiratory distress. He has no wheezes. He has no rales.          Assessment & Plan:  Type 2 diabetes. Poorly controlled by home readings. Start metformin 500 mg twice a day. After 2 weeks, if  fasting blood sugars not consistently less than 130 he will add glipizide 5 mg once daily. We'll plan follow-up 2 months and repeat A1c then

## 2014-12-31 ENCOUNTER — Telehealth: Payer: Self-pay | Admitting: Family Medicine

## 2015-01-20 ENCOUNTER — Encounter: Payer: Self-pay | Admitting: Family Medicine

## 2015-01-21 ENCOUNTER — Other Ambulatory Visit: Payer: Self-pay

## 2015-01-21 MED ORDER — ONETOUCH LANCETS MISC: Status: AC

## 2015-01-21 MED ORDER — ONETOUCH ULTRA BLUE VI STRP: ORAL_STRIP | Status: DC

## 2015-02-24 ENCOUNTER — Other Ambulatory Visit: Payer: Self-pay

## 2015-02-28 ENCOUNTER — Other Ambulatory Visit: Payer: Self-pay | Admitting: Family Medicine

## 2015-02-28 NOTE — Telephone Encounter (Signed)
Can you find out what he takes this for, not refilled in some time. Please let him know PCP out of office and may need OV. Thanks.

## 2015-02-28 NOTE — Telephone Encounter (Signed)
I called the pt and he stated Dr Caryl NeverBurchette usually gives him this for an upset that he has and always gives him #90 once a year.  I informed the patient Dr Caryl NeverBurchette will address this next week when he returns to the office.

## 2015-03-05 ENCOUNTER — Ambulatory Visit: Payer: 59 | Admitting: Family Medicine

## 2015-03-21 ENCOUNTER — Other Ambulatory Visit (INDEPENDENT_AMBULATORY_CARE_PROVIDER_SITE_OTHER): Payer: 59

## 2015-03-21 DIAGNOSIS — E784 Other hyperlipidemia: Secondary | ICD-10-CM | POA: Diagnosis not present

## 2015-03-21 DIAGNOSIS — E119 Type 2 diabetes mellitus without complications: Secondary | ICD-10-CM | POA: Diagnosis not present

## 2015-03-21 DIAGNOSIS — E785 Hyperlipidemia, unspecified: Secondary | ICD-10-CM

## 2015-03-21 LAB — LIPID PANEL
Cholesterol: 224 mg/dL — ABNORMAL HIGH (ref 0–200)
HDL: 29.9 mg/dL — ABNORMAL LOW (ref >39.00)
NonHDL: 194.1
TRIGLYCERIDES: 217 mg/dL — AB (ref 0.0–149.0)
Total CHOL/HDL Ratio: 7
VLDL: 43.4 mg/dL — AB (ref 0.0–40.0)

## 2015-03-21 LAB — HEMOGLOBIN A1C: HEMOGLOBIN A1C: 7.3 % — AB (ref 4.6–6.5)

## 2015-03-21 LAB — LDL CHOLESTEROL, DIRECT: Direct LDL: 151 mg/dL

## 2015-05-03 ENCOUNTER — Other Ambulatory Visit: Payer: Self-pay | Admitting: Family Medicine

## 2015-07-15 ENCOUNTER — Other Ambulatory Visit (INDEPENDENT_AMBULATORY_CARE_PROVIDER_SITE_OTHER): Payer: 59

## 2015-07-15 DIAGNOSIS — R7989 Other specified abnormal findings of blood chemistry: Secondary | ICD-10-CM

## 2015-07-15 DIAGNOSIS — Z0000 Encounter for general adult medical examination without abnormal findings: Secondary | ICD-10-CM

## 2015-07-15 LAB — BASIC METABOLIC PANEL
BUN: 14 mg/dL (ref 6–23)
CALCIUM: 9.6 mg/dL (ref 8.4–10.5)
CO2: 30 meq/L (ref 19–32)
Chloride: 100 mEq/L (ref 96–112)
Creatinine, Ser: 0.97 mg/dL (ref 0.40–1.50)
GFR: 88.41 mL/min (ref >60.00)
GLUCOSE: 208 mg/dL — AB (ref 70–99)
Potassium: 4 mEq/L (ref 3.5–5.1)
Sodium: 138 mEq/L (ref 135–145)

## 2015-07-15 LAB — CBC WITH DIFFERENTIAL/PLATELET
BASOS PCT: 0.8 % (ref 0.0–3.0)
Basophils Absolute: 0.1 10*3/uL (ref 0.0–0.1)
Eosinophils Absolute: 0.2 10*3/uL (ref 0.0–0.7)
Eosinophils Relative: 3.3 % (ref 0.0–5.0)
HCT: 43.4 % (ref 39.0–52.0)
Hemoglobin: 14.5 g/dL (ref 13.0–17.0)
LYMPHS ABS: 2.2 10*3/uL (ref 0.7–4.0)
LYMPHS PCT: 32.2 % (ref 12.0–46.0)
MCHC: 33.4 g/dL (ref 30.0–36.0)
MCV: 86.8 fl (ref 78.0–100.0)
MONO ABS: 0.6 10*3/uL (ref 0.1–1.0)
Monocytes Relative: 8.1 % (ref 3.0–12.0)
NEUTROS ABS: 3.8 10*3/uL (ref 1.4–7.7)
NEUTROS PCT: 55.6 % (ref 43.0–77.0)
Platelets: 224 10*3/uL (ref 150.0–400.0)
RBC: 5 Mil/uL (ref 4.22–5.81)
RDW: 15 % (ref 11.5–15.5)
WBC: 6.9 10*3/uL (ref 4.0–10.5)

## 2015-07-15 LAB — TSH: TSH: 1.15 u[IU]/mL (ref 0.35–4.50)

## 2015-07-15 LAB — LIPID PANEL
CHOL/HDL RATIO: 9
CHOLESTEROL: 235 mg/dL — AB (ref 0–200)
HDL: 27 mg/dL — AB (ref >39.00)
NONHDL: 208.31
TRIGLYCERIDES: 331 mg/dL — AB (ref 0.0–149.0)
VLDL: 66.2 mg/dL — AB (ref 0.0–40.0)

## 2015-07-15 LAB — HEMOGLOBIN A1C: Hgb A1c MFr Bld: 8.3 % — ABNORMAL HIGH (ref 4.6–6.5)

## 2015-07-15 LAB — HEPATIC FUNCTION PANEL
ALT: 44 U/L (ref 0–53)
AST: 24 U/L (ref 0–37)
Albumin: 4.2 g/dL (ref 3.5–5.2)
Alkaline Phosphatase: 108 U/L (ref 39–117)
BILIRUBIN TOTAL: 0.7 mg/dL (ref 0.2–1.2)
Bilirubin, Direct: 0.1 mg/dL (ref 0.0–0.3)
Total Protein: 7.4 g/dL (ref 6.0–8.3)

## 2015-07-15 LAB — LDL CHOLESTEROL, DIRECT: Direct LDL: 138 mg/dL

## 2015-07-18 ENCOUNTER — Encounter: Payer: Self-pay | Admitting: Family Medicine

## 2015-07-18 ENCOUNTER — Ambulatory Visit (INDEPENDENT_AMBULATORY_CARE_PROVIDER_SITE_OTHER): Payer: 59 | Admitting: Family Medicine

## 2015-07-18 VITALS — BP 136/94 | HR 93 | Temp 98.3°F | Resp 16 | Ht 68.0 in | Wt 210.1 lb

## 2015-07-18 DIAGNOSIS — E784 Other hyperlipidemia: Secondary | ICD-10-CM

## 2015-07-18 DIAGNOSIS — Z0000 Encounter for general adult medical examination without abnormal findings: Secondary | ICD-10-CM

## 2015-07-18 DIAGNOSIS — E785 Hyperlipidemia, unspecified: Secondary | ICD-10-CM

## 2015-07-18 DIAGNOSIS — E119 Type 2 diabetes mellitus without complications: Secondary | ICD-10-CM

## 2015-07-18 MED ORDER — PRAVASTATIN SODIUM 20 MG PO TABS: 20.0000 mg | ORAL_TABLET | Freq: Every day | ORAL | Status: DC

## 2015-07-18 MED ORDER — METFORMIN HCL 500 MG PO TABS: ORAL_TABLET | ORAL | Status: DC

## 2015-07-18 NOTE — Progress Notes (Signed)
Subjective:    Patient ID: Kristopher Holland, male    DOB: 20-Jul-1971, 44 y.o.   MRN: [id number]  HPI Patient here for complete physical. He's had a couple back surgeries in the past year and is finally recovering. He has just started back more consistent exercise.Marland Kitchen He has type 2 diabetes diagnosed within the past year. Currently taking metformin. Not taking glipizide consistently. Recent fasting blood sugars usually run 140 or greater. No history of hypertension. Very reluctant to take medications. He does take metformin 500 mg twice daily. He's been intolerant of Lipitor and Crestor in the past. Declines vaccinations including pneumonia vaccine and flu vaccine.  Past Medical History  Diagnosis Date  . HYPERLIPIDEMIA 01/07/2009  . ALLERGIC RHINITIS 01/07/2009  . GERD 01/07/2009  . LUMBAR STRAIN 01/29/2010  . Diabetes mellitus without complication [facility])    Past Surgical History  Procedure Laterality Date  . Tonsillectomy  1976    reports that he has never smoked. He does not have any smokeless tobacco history on file. He reports that he does not drink alcohol. His drug history is not on file. family history includes Cancer in his father, other, and paternal grandfather; Diabetes in his paternal grandfather. Allergies  Allergen Reactions  . Prednisone Other (See Comments)    Made blood sugar levels "go into the 600's."  Patient is not diabetic.  Marland Kitchen Penicillins Rash      Review of Systems  Constitutional: Negative for fever, activity change, appetite change and fatigue.  HENT: Negative for congestion, ear pain and trouble swallowing.   Eyes: Negative for pain and visual disturbance.  Respiratory: Negative for cough, shortness of breath and wheezing.   Cardiovascular: Negative for chest pain and palpitations.  Gastrointestinal: Negative for nausea, vomiting, abdominal pain, diarrhea, constipation, blood in stool, abdominal distention and rectal pain.  Genitourinary: Negative for dysuria,  hematuria and testicular pain.  Musculoskeletal: Negative for joint swelling and arthralgias.  Skin: Negative for rash.  Neurological: Negative for dizziness, syncope and headaches.  Hematological: Negative for adenopathy.  Psychiatric/Behavioral: Negative for confusion and dysphoric mood.       Objective:   Physical Exam  Constitutional: He is oriented to person, place, and time. He appears well-developed and well-nourished. No distress.  HENT:  Head: Normocephalic and atraumatic.  Right Ear: External ear normal.  Left Ear: External ear normal.  Mouth/Throat: Oropharynx is clear and moist.  Eyes: Conjunctivae and EOM are normal. Pupils are equal, round, and reactive to light.  Neck: Normal range of motion. Neck supple. No thyromegaly present.  Cardiovascular: Normal rate, regular rhythm and normal heart sounds.   No murmur heard. Pulmonary/Chest: No respiratory distress. He has no wheezes. He has no rales.  Abdominal: Soft. Bowel sounds are normal. He exhibits no distension and no mass. There is no tenderness. There is no rebound and no guarding.  Musculoskeletal: He exhibits no edema.  Lymphadenopathy:    He has no cervical adenopathy.  Neurological: He is alert and oriented to person, place, and time. He displays normal reflexes. No cranial nerve deficit.  Skin: No rash noted.  Psychiatric: He has a normal mood and affect.          Assessment & Plan:  Here for complete physical. Recommend flu and Pneumovax and he declines. Tetanus booster next year.Labs reviewed.  poorly controlled lipids and A1c 8.3%.  Increase metformin to 500 mg 2 twice daily and lose some weight. Recheck A1c 3 months. Consider additional medication at that time if  not better controlled. Start pravastatin 20 mg daily. Recheck lipids in 3 months

## 2015-07-18 NOTE — Patient Instructions (Signed)
Let's plan follow up labs in 3 months Go ahead and increase the metformin to two twice daily

## 2015-08-02 ENCOUNTER — Other Ambulatory Visit: Payer: Self-pay | Admitting: Family Medicine

## 2015-08-19 ENCOUNTER — Encounter: Payer: Self-pay | Admitting: Family Medicine

## 2015-08-20 ENCOUNTER — Other Ambulatory Visit: Payer: Self-pay | Admitting: Family Medicine

## 2015-08-20 MED ORDER — METFORMIN HCL 500 MG PO TABS: ORAL_TABLET | ORAL | Status: DC

## 2015-08-20 NOTE — Telephone Encounter (Signed)
Pt needs new rx metformin 500 mg two pills twice a day. #120 w/refills send to [facility] Pt metformin has been increase

## 2015-08-20 NOTE — Telephone Encounter (Signed)
Medication sent into pharmacy  

## 2015-10-20 ENCOUNTER — Other Ambulatory Visit (INDEPENDENT_AMBULATORY_CARE_PROVIDER_SITE_OTHER): Payer: 59

## 2015-10-20 ENCOUNTER — Encounter: Payer: Self-pay | Admitting: Family Medicine

## 2015-10-20 DIAGNOSIS — E784 Other hyperlipidemia: Secondary | ICD-10-CM

## 2015-10-20 DIAGNOSIS — E119 Type 2 diabetes mellitus without complications: Secondary | ICD-10-CM

## 2015-10-20 DIAGNOSIS — E785 Hyperlipidemia, unspecified: Secondary | ICD-10-CM

## 2015-10-20 LAB — HEPATIC FUNCTION PANEL
ALT: 27 U/L (ref 0–53)
AST: 17 U/L (ref 0–37)
Albumin: 4.5 g/dL (ref 3.5–5.2)
Alkaline Phosphatase: 90 U/L (ref 39–117)
BILIRUBIN DIRECT: 0.1 mg/dL (ref 0.0–0.3)
TOTAL PROTEIN: 7.1 g/dL (ref 6.0–8.3)
Total Bilirubin: 0.4 mg/dL (ref 0.2–1.2)

## 2015-10-20 LAB — LDL CHOLESTEROL, DIRECT: LDL DIRECT: 125 mg/dL

## 2015-10-20 LAB — LIPID PANEL
CHOL/HDL RATIO: 7
Cholesterol: 189 mg/dL (ref 0–200)
HDL: 27.5 mg/dL — ABNORMAL LOW (ref >39.00)
NonHDL: 161.15
Triglycerides: 215 mg/dL — ABNORMAL HIGH (ref 0.0–149.0)
VLDL: 43 mg/dL — AB (ref 0.0–40.0)

## 2015-10-20 LAB — HEMOGLOBIN A1C: Hgb A1c MFr Bld: 7.9 % — ABNORMAL HIGH (ref 4.6–6.5)

## 2015-11-12 ENCOUNTER — Other Ambulatory Visit: Payer: Self-pay | Admitting: Family Medicine

## 2015-11-12 NOTE — Telephone Encounter (Signed)
May refill #20 with no refills.

## 2015-11-12 NOTE — Telephone Encounter (Signed)
Last seen on 07/18/2015 Last refill was 08/05/2015 #30, 1rf Please review

## 2016-01-17 ENCOUNTER — Other Ambulatory Visit: Payer: Self-pay | Admitting: Family Medicine

## 2016-03-12 ENCOUNTER — Other Ambulatory Visit: Payer: Self-pay | Admitting: Family Medicine

## 2016-07-14 ENCOUNTER — Other Ambulatory Visit: Payer: Self-pay | Admitting: Orthopaedic Surgery

## 2016-07-14 DIAGNOSIS — M4326 Fusion of spine, lumbar region: Secondary | ICD-10-CM

## 2016-07-26 ENCOUNTER — Ambulatory Visit
Admission: RE | Admit: 2016-07-26 | Discharge: 2016-07-26 | Disposition: A | Discharge status: Home / Self Care | Payer: BLUE CROSS/BLUE SHIELD | Source: Ambulatory Visit | Attending: Orthopaedic Surgery | Admitting: Orthopaedic Surgery

## 2016-07-26 DIAGNOSIS — M4326 Fusion of spine, lumbar region: Secondary | ICD-10-CM

## 2016-07-28 ENCOUNTER — Other Ambulatory Visit: Payer: Self-pay | Admitting: Family Medicine

## 2016-07-30 ENCOUNTER — Other Ambulatory Visit (INDEPENDENT_AMBULATORY_CARE_PROVIDER_SITE_OTHER): Payer: BLUE CROSS/BLUE SHIELD

## 2016-07-30 DIAGNOSIS — Z0000 Encounter for general adult medical examination without abnormal findings: Secondary | ICD-10-CM

## 2016-07-30 DIAGNOSIS — R7989 Other specified abnormal findings of blood chemistry: Secondary | ICD-10-CM | POA: Diagnosis not present

## 2016-07-30 LAB — HEPATIC FUNCTION PANEL
ALBUMIN: 4.3 g/dL (ref 3.5–5.2)
ALK PHOS: 98 U/L (ref 39–117)
ALT: 42 U/L (ref 0–53)
AST: 25 U/L (ref 0–37)
Bilirubin, Direct: 0.1 mg/dL (ref 0.0–0.3)
TOTAL PROTEIN: 7 g/dL (ref 6.0–8.3)
Total Bilirubin: 0.7 mg/dL (ref 0.2–1.2)

## 2016-07-30 LAB — LIPID PANEL
CHOLESTEROL: 205 mg/dL — AB (ref 0–200)
HDL: 23.9 mg/dL — AB (ref >39.00)
NonHDL: 180.94
Total CHOL/HDL Ratio: 9
Triglycerides: 266 mg/dL — ABNORMAL HIGH (ref 0.0–149.0)
VLDL: 53.2 mg/dL — ABNORMAL HIGH (ref 0.0–40.0)

## 2016-07-30 LAB — CBC WITH DIFFERENTIAL/PLATELET
BASOS PCT: 0.5 % (ref 0.0–3.0)
Basophils Absolute: 0 10*3/uL (ref 0.0–0.1)
EOS PCT: 3.4 % (ref 0.0–5.0)
Eosinophils Absolute: 0.2 10*3/uL (ref 0.0–0.7)
HEMATOCRIT: 40.6 % (ref 39.0–52.0)
Hemoglobin: 13.7 g/dL (ref 13.0–17.0)
LYMPHS ABS: 2.1 10*3/uL (ref 0.7–4.0)
LYMPHS PCT: 33.8 % (ref 12.0–46.0)
MCHC: 33.8 g/dL (ref 30.0–36.0)
MCV: 87.4 fl (ref 78.0–100.0)
MONOS PCT: 9.1 % (ref 3.0–12.0)
Monocytes Absolute: 0.6 10*3/uL (ref 0.1–1.0)
NEUTROS ABS: 3.3 10*3/uL (ref 1.4–7.7)
NEUTROS PCT: 53.2 % (ref 43.0–77.0)
PLATELETS: 241 10*3/uL (ref 150.0–400.0)
RBC: 4.64 Mil/uL (ref 4.22–5.81)
RDW: 14.5 % (ref 11.5–15.5)
WBC: 6.2 10*3/uL (ref 4.0–10.5)

## 2016-07-30 LAB — BASIC METABOLIC PANEL
BUN: 14 mg/dL (ref 6–23)
CHLORIDE: 102 meq/L (ref 96–112)
CO2: 30 meq/L (ref 19–32)
Calcium: 9.3 mg/dL (ref 8.4–10.5)
Creatinine, Ser: 0.9 mg/dL (ref 0.40–1.50)
GFR: 95.95 mL/min (ref >60.00)
GLUCOSE: 189 mg/dL — AB (ref 70–99)
Potassium: 4 mEq/L (ref 3.5–5.1)
SODIUM: 140 meq/L (ref 135–145)

## 2016-07-30 LAB — TSH: TSH: 0.65 u[IU]/mL (ref 0.35–4.50)

## 2016-07-30 LAB — MICROALBUMIN / CREATININE URINE RATIO
Creatinine,U: 286.2 mg/dL
MICROALB/CREAT RATIO: 1.2 mg/g (ref 0.0–30.0)
Microalb, Ur: 3.4 mg/dL — ABNORMAL HIGH (ref 0.0–1.9)

## 2016-07-30 LAB — HEMOGLOBIN A1C: Hgb A1c MFr Bld: 8.1 % — ABNORMAL HIGH (ref 4.6–6.5)

## 2016-07-30 LAB — LDL CHOLESTEROL, DIRECT: LDL DIRECT: 125 mg/dL

## 2016-08-02 ENCOUNTER — Other Ambulatory Visit: Payer: Self-pay | Admitting: Family Medicine

## 2016-08-06 ENCOUNTER — Ambulatory Visit (INDEPENDENT_AMBULATORY_CARE_PROVIDER_SITE_OTHER): Payer: BLUE CROSS/BLUE SHIELD | Admitting: Family Medicine

## 2016-08-06 ENCOUNTER — Encounter: Payer: Self-pay | Admitting: Family Medicine

## 2016-08-06 VITALS — BP 122/80 | HR 75 | Temp 98.3°F | Ht 68.0 in | Wt 208.0 lb

## 2016-08-06 DIAGNOSIS — E119 Type 2 diabetes mellitus without complications: Secondary | ICD-10-CM | POA: Diagnosis not present

## 2016-08-06 DIAGNOSIS — E785 Hyperlipidemia, unspecified: Secondary | ICD-10-CM | POA: Diagnosis not present

## 2016-08-06 DIAGNOSIS — Z0000 Encounter for general adult medical examination without abnormal findings: Secondary | ICD-10-CM | POA: Diagnosis not present

## 2016-08-06 DIAGNOSIS — Z23 Encounter for immunization: Secondary | ICD-10-CM

## 2016-08-06 MED ORDER — PRAVASTATIN SODIUM 40 MG PO TABS: 40.0000 mg | ORAL_TABLET | Freq: Every day | ORAL | 3 refills | Status: DC

## 2016-08-06 MED ORDER — EMPAGLIFLOZIN 10 MG PO TABS: 10.0000 mg | ORAL_TABLET | Freq: Every day | ORAL | 3 refills | Status: DC

## 2016-08-06 NOTE — Progress Notes (Signed)
Pre visit review using our clinic review tool, if applicable. No additional management support is needed unless otherwise documented below in the visit note. 

## 2016-08-06 NOTE — Patient Instructions (Signed)
Try to lose some weight Let's plan on follow up in 3 months and will repeat lipids and A1C then

## 2016-08-06 NOTE — Progress Notes (Signed)
Subjective:    Patient ID: Kristopher Holland, male    DOB: 05/09/1971, 45 y.o.   MRN: [id number]  HPI Patient seen for physical exam. He [place] his [place]. He has chronic problems are type 2 diabetes, obesity, dyslipidemia, GERD. He's had ongoing issues with neck pain and is followed closely by neurosurgery and anticipates probably getting neck surgery over the next few months. He is having some ongoing left upper extremity radiculopathy symptoms. He is exercising regularly.  Patient is nonsmoker. No regular alcohol use. Has not had close follow-up for diabetes. Currently takes metformin 1000 g twice daily. Briefly took Glucotrol but not for several months now. He's had previous intolerance with Crestor and Lipitor with myalgias but has tolerated pravastatin 20 mg daily. He states he is compliant with that therapy. Last tetanus unknown. Declines flu vaccine  Past Medical History:  Diagnosis Date  . ALLERGIC RHINITIS 01/07/2009  . Diabetes mellitus without complication (HCC)   . GERD 01/07/2009  . HYPERLIPIDEMIA 01/07/2009  . LUMBAR STRAIN 01/29/2010   Past Surgical History:  Procedure Laterality Date  . BACK SURGERY    . TONSILLECTOMY  1976    reports that he has never smoked. He has never used smokeless tobacco. He reports that he does not drink alcohol or use drugs. family history includes Cancer in his father, other, and paternal grandfather; Diabetes in his paternal grandfather. Allergies  Allergen Reactions  . Prednisone Other (See Comments)    Made blood sugar levels "go into the 600's."  Patient is not diabetic.  Marland Kitchen. Penicillins Rash      Review of Systems  Constitutional: Negative for activity change, appetite change, fatigue and fever.  HENT: Negative for congestion, ear pain and trouble swallowing.   Eyes: Negative for pain and visual disturbance.  Respiratory: Negative for cough, shortness of breath and wheezing.   Cardiovascular:  Negative for chest pain and palpitations.  Gastrointestinal: Negative for abdominal distention, abdominal pain, blood in stool, constipation, diarrhea, nausea, rectal pain and vomiting.  Endocrine: Negative for polydipsia and polyuria.  Genitourinary: Negative for dysuria, hematuria and testicular pain.  Musculoskeletal: Positive for neck pain. Negative for arthralgias and joint swelling.  Skin: Negative for rash.  Neurological: Negative for dizziness, syncope and headaches.  Hematological: Negative for adenopathy.  Psychiatric/Behavioral: Negative for confusion and dysphoric mood.       Objective:   Physical Exam  Constitutional: He is oriented to person, place, and time. He appears well-developed and well-nourished. No distress.  HENT:  Head: Normocephalic and atraumatic.  Right Ear: External ear normal.  Left Ear: External ear normal.  Mouth/Throat: Oropharynx is clear and moist.  Eyes: Conjunctivae and EOM are normal. Pupils are equal, round, and reactive to light.  Neck: Normal range of motion. Neck supple. No thyromegaly present.  Cardiovascular: Normal rate, regular rhythm and normal heart sounds.   No murmur heard. Pulmonary/Chest: No respiratory distress. He has no wheezes. He has no rales.  Abdominal: Soft. Bowel sounds are normal. He exhibits no distension and no mass. There is no tenderness. There is no rebound and no guarding.  Musculoskeletal: He exhibits no edema.  Lymphadenopathy:    He has no cervical adenopathy.  Neurological: He is alert and oriented to person, place, and time. He displays normal reflexes. No cranial nerve deficit.  Skin: No rash noted.  Psychiatric: He has a normal mood and affect.          Assessment &  Plan:  #1 physical exam. Patient needs Tdap and this was given.  Also needs flu vaccination but declines  #2 type 2 diabetes suboptimally controlled with A1c 8.1%. Continue metformin. Lose some weight. We discussed options for additional  medications. Agreed to trial of Jardiance 10 mg once daily. Reviewed possible side effects. Reassess in 3 months  #3 dyslipidemia. Recent LDL 125. Increase pravastatin to 40 mg once daily and reassess lipids at follow-up  Kristian CoveyBruce W Burchette MD [facility]

## 2016-09-01 ENCOUNTER — Telehealth: Payer: Self-pay

## 2016-09-01 NOTE — Telephone Encounter (Signed)
Okay to change medication?  

## 2016-09-01 NOTE — Telephone Encounter (Signed)
Received PA request for Jardiance from [facility]. Insurance [place], okay to switch?

## 2016-09-01 NOTE — Telephone Encounter (Signed)
Farxiga 5 mg po qd

## 2016-09-02 MED ORDER — DAPAGLIFLOZIN PROPANEDIOL 5 MG PO TABS: 5.0000 mg | ORAL_TABLET | Freq: Every day | ORAL | 3 refills | Status: DC

## 2016-09-02 NOTE — Telephone Encounter (Signed)
Rx changed to [place] per Dr. Lucie LeatherBurchette's note.

## 2016-09-02 NOTE — Telephone Encounter (Signed)
Pharmacy sent over pre authorization for Jardiance. Would like for us to review that and send it back.

## 2016-09-16 ENCOUNTER — Other Ambulatory Visit: Payer: Self-pay | Admitting: Family Medicine

## 2016-10-15 ENCOUNTER — Encounter: Payer: Self-pay | Admitting: Family Medicine

## 2016-10-15 ENCOUNTER — Ambulatory Visit (INDEPENDENT_AMBULATORY_CARE_PROVIDER_SITE_OTHER): Payer: BLUE CROSS/BLUE SHIELD | Admitting: Family Medicine

## 2016-10-15 VITALS — BP 120/80 | HR 75 | Ht 68.0 in | Wt 210.0 lb

## 2016-10-15 DIAGNOSIS — E784 Other hyperlipidemia: Secondary | ICD-10-CM

## 2016-10-15 DIAGNOSIS — E119 Type 2 diabetes mellitus without complications: Secondary | ICD-10-CM

## 2016-10-15 DIAGNOSIS — E785 Hyperlipidemia, unspecified: Secondary | ICD-10-CM

## 2016-10-15 NOTE — Progress Notes (Signed)
Subjective:     Patient ID: Kristopher Holland, male   DOB: 02/02/1971, 46 y.o.   MRN: [id number]  HPI Follow-up type 2 diabetes. Patient stepped up his exercise since last visit. We added Farxiga 5 mg daily. He has not had any urine frequency or UTI type symptoms. He was concerned that he has perhaps had some slight personality changes with more irritability and we explained that we were not aware of this was a side effect with this class of medication. Those changes have been mild. We also increased his pravastatin to 40 mg daily. Recent LDL cholesterol 125. Last A1c 8.1%. Fasting blood sugars been mostly around 150. He is exercising 4-5 days per week. No hypoglycemia. Also remains  on metformin.  Past Medical History:  Diagnosis Date  . ALLERGIC RHINITIS 01/07/2009  . Diabetes mellitus without complication (HCC)   . GERD 01/07/2009  . HYPERLIPIDEMIA 01/07/2009  . LUMBAR STRAIN 01/29/2010   Past Surgical History:  Procedure Laterality Date  . BACK SURGERY    . TONSILLECTOMY  1976    reports that he has never smoked. He has never used smokeless tobacco. He reports that he does not drink alcohol or use drugs. family history includes Cancer in his father, other, and paternal grandfather; Diabetes in his paternal grandfather. Allergies  Allergen Reactions  . Prednisone Other (See Comments)    Made blood sugar levels "go into the 600's."  Patient is not diabetic.  Marland Kitchen. Penicillins Rash     Review of Systems  Constitutional: Negative for fatigue.  Eyes: Negative for visual disturbance.  Respiratory: Negative for cough, chest tightness and shortness of breath.   Cardiovascular: Negative for chest pain, palpitations and leg swelling.  Endocrine: Negative for polydipsia and polyuria.  Neurological: Negative for dizziness, syncope, weakness, light-headedness and headaches.       Objective:   Physical Exam  Constitutional: He is oriented to person, place, and time. He appears well-developed and  well-nourished.  HENT:  Right Ear: External ear normal.  Left Ear: External ear normal.  Mouth/Throat: Oropharynx is clear and moist.  Eyes: Pupils are equal, round, and reactive to light.  Neck: Neck supple. No thyromegaly present.  Cardiovascular: Normal rate and regular rhythm.   Pulmonary/Chest: Effort normal and breath sounds normal. No respiratory distress. He has no wheezes. He has no rales.  Musculoskeletal: He exhibits no edema.  Neurological: He is alert and oriented to person, place, and time.       Assessment:     Type 2 diabetes with history of recent poor control  Dyslipidemia    Plan:     -patient is scheduled labs in March and will recheck A1c and lipids then -Continue current medications  Kristian CoveyBruce W Burchette MD [facility]

## 2016-10-15 NOTE — Progress Notes (Signed)
Pre visit review using our clinic review tool, if applicable. No additional management support is needed unless otherwise documented below in the visit note. 

## 2016-10-29 ENCOUNTER — Other Ambulatory Visit (INDEPENDENT_AMBULATORY_CARE_PROVIDER_SITE_OTHER): Payer: BLUE CROSS/BLUE SHIELD

## 2016-10-29 DIAGNOSIS — E784 Other hyperlipidemia: Secondary | ICD-10-CM | POA: Diagnosis not present

## 2016-10-29 DIAGNOSIS — E119 Type 2 diabetes mellitus without complications: Secondary | ICD-10-CM | POA: Diagnosis not present

## 2016-10-29 DIAGNOSIS — E785 Hyperlipidemia, unspecified: Secondary | ICD-10-CM

## 2016-10-29 LAB — HEMOGLOBIN A1C: HEMOGLOBIN A1C: 8.4 % — AB (ref 4.6–6.5)

## 2016-10-29 LAB — LIPID PANEL
Cholesterol: 203 mg/dL — ABNORMAL HIGH (ref 0–200)
HDL: 29.7 mg/dL — AB (ref >39.00)
LDL Cholesterol: 139 mg/dL — ABNORMAL HIGH (ref 0–99)
NONHDL: 172.87
Total CHOL/HDL Ratio: 7
Triglycerides: 170 mg/dL — ABNORMAL HIGH (ref 0.0–149.0)
VLDL: 34 mg/dL (ref 0.0–40.0)

## 2016-11-01 ENCOUNTER — Other Ambulatory Visit: Payer: BLUE CROSS/BLUE SHIELD

## 2016-11-05 ENCOUNTER — Ambulatory Visit: Payer: BLUE CROSS/BLUE SHIELD | Admitting: Family Medicine

## 2016-11-08 ENCOUNTER — Ambulatory Visit: Payer: BLUE CROSS/BLUE SHIELD | Admitting: Family Medicine

## 2016-11-12 ENCOUNTER — Other Ambulatory Visit: Payer: Self-pay | Admitting: Family Medicine

## 2017-01-19 ENCOUNTER — Ambulatory Visit (INDEPENDENT_AMBULATORY_CARE_PROVIDER_SITE_OTHER): Payer: BLUE CROSS/BLUE SHIELD | Admitting: Family Medicine

## 2017-01-19 ENCOUNTER — Encounter: Payer: Self-pay | Admitting: Family Medicine

## 2017-01-19 VITALS — BP 108/78 | HR 90 | Temp 98.7°F | Wt 205.0 lb

## 2017-01-19 DIAGNOSIS — K116 Mucocele of salivary gland: Secondary | ICD-10-CM | POA: Diagnosis not present

## 2017-01-19 NOTE — Progress Notes (Signed)
Subjective:     Patient ID: Kristopher Holland, male   DOB: 04/26/1971, 46 y.o.   MRN: [id number]  HPI Patient is seen with small "lump "noted underneath his right mandible which was noted about a week ago. He went to his dentist and was told to follow-up here. He has not noted any adenopathy. No pain. No sore throat. No intraoral lesions.  Type 2 diabetes with history of poor control. Last A1c 8.4%. He relates some positive changes with exercise and diet since then has lost 5 pounds and feels better overall  Past Medical History:  Diagnosis Date  . ALLERGIC RHINITIS 01/07/2009  . Diabetes mellitus without complication (HCC)   . GERD 01/07/2009  . HYPERLIPIDEMIA 01/07/2009  . LUMBAR STRAIN 01/29/2010   Past Surgical History:  Procedure Laterality Date  . BACK SURGERY    . TONSILLECTOMY  1976    reports that he has never smoked. He has never used smokeless tobacco. He reports that he does not drink alcohol or use drugs. family history includes Cancer in his father, other, and paternal grandfather; Diabetes in his paternal grandfather. Allergies  Allergen Reactions  . Prednisone Other (See Comments)    Made blood sugar levels "go into the 600's."  Patient is not diabetic.  Marland Kitchen. Penicillins Rash     Review of Systems  Constitutional: Negative for chills and fever.  HENT: Negative for sore throat and trouble swallowing.   Hematological: Negative for adenopathy.       Objective:   Physical Exam  Constitutional: He appears well-developed and well-nourished.  HENT:  Mouth/Throat: Oropharynx is clear and moist.  Neck: Neck supple.  Patient has very small approximately 1/2 cm very mobile rounded cystic type lesion which is in the region of the right mandible near submandibular gland. Nontender  Lymphadenopathy:    He has no cervical adenopathy.       Assessment:     Probable benign mucous cyst right submandibular region. He does not have any adenopathy or other concerning features     Plan:     -Reassurance. -Continue weight loss efforts and reassess in 2 months and repeat A1c then  Kristian CoveyBruce W Gearldean Lomanto MD [facility]

## 2017-01-19 NOTE — Patient Instructions (Signed)
Suspect benign mucus cyst Follow up for any rapid growth or other changes Let's plan to repeat A1C in about 2 months.

## 2017-01-31 IMAGING — MR MR CERVICAL SPINE W/O CM
4 of 5 series · 27 of 48 positions shown · non-contrast
Comparison: None.

CLINICAL DATA: 45-year-old male left-sided neck and arm pain for 3
months. No known injury. Initial encounter.

EXAM:
MRI CERVICAL SPINE WITHOUT CONTRAST
TECHNIQUE: Multiplanar, multisequence MR imaging of the cervical spine was
performed. No intravenous contrast was administered.

[Series 5: T1 · sagittal · 3.0mm · 0.66mm/px · 7 of 15 slices shown]
[im 1/15]
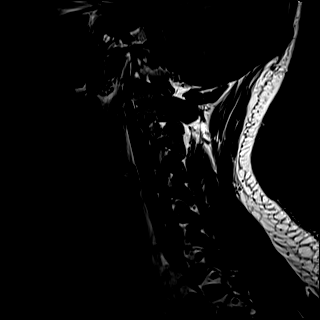
[im 3/15]
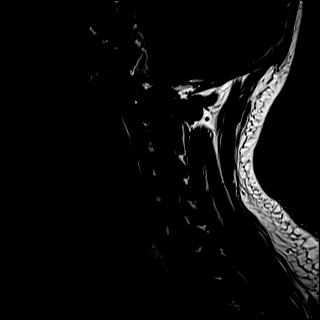
[im 5/15]
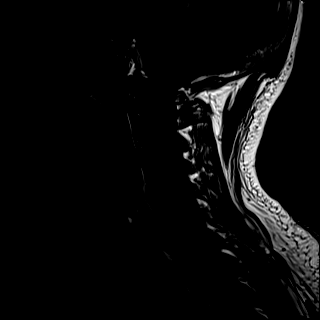
[im 8/15]
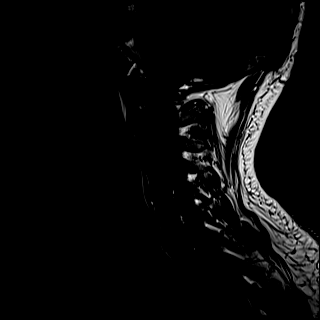
[im 10/15]
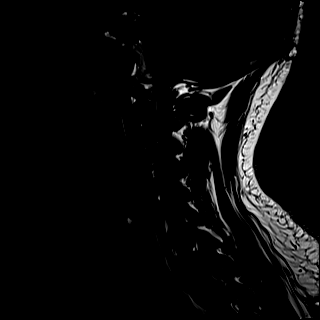
[im 12/15]
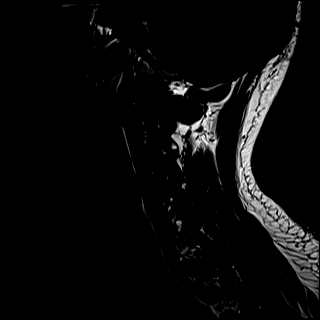
[im 15/15]
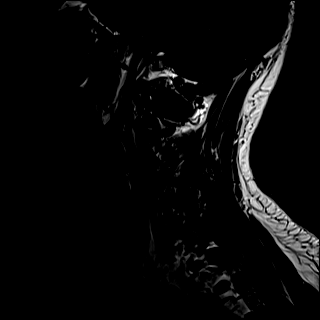

[Series 6: T2 · sagittal · 3.0mm · 0.55mm/px · 7 of 15 slices shown (1 of 2)]
[im 1/15]
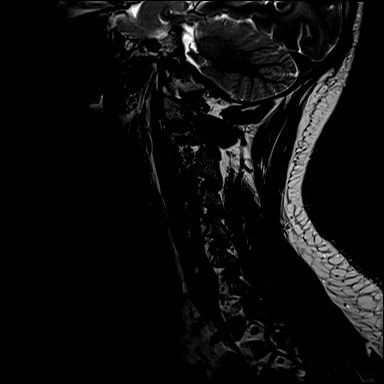
[im 3/15]
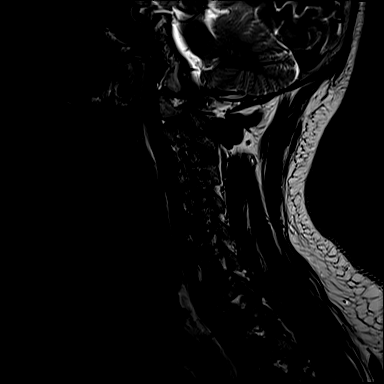
[im 5/15]
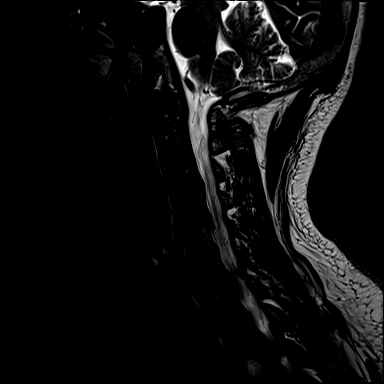
[im 8/15]
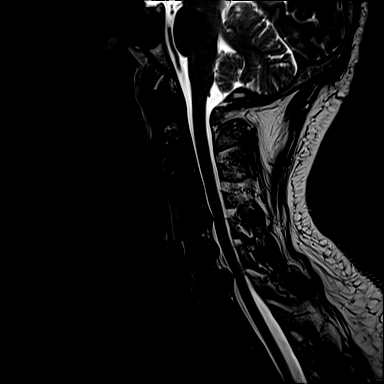
[im 10/15]
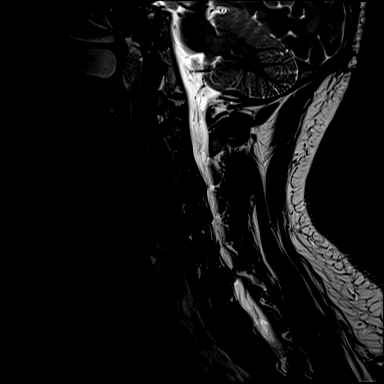
[im 12/15]
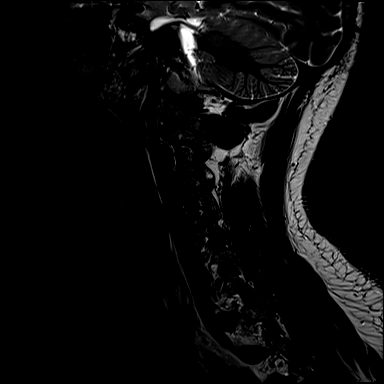
[im 15/15]
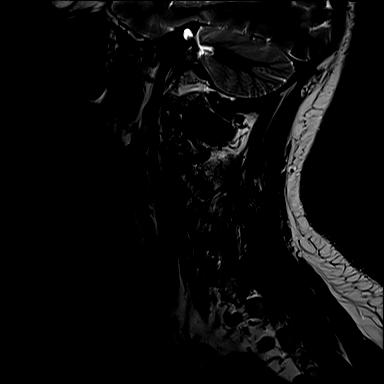

[Series 7: STIR · sagittal · 3.0mm · 0.33mm/px · 5 of 15 slices shown]
[im 1/15]
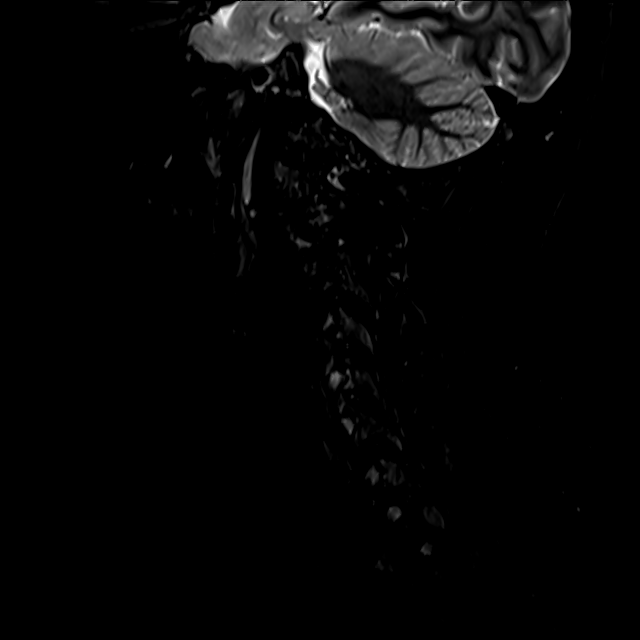
[im 3/15]
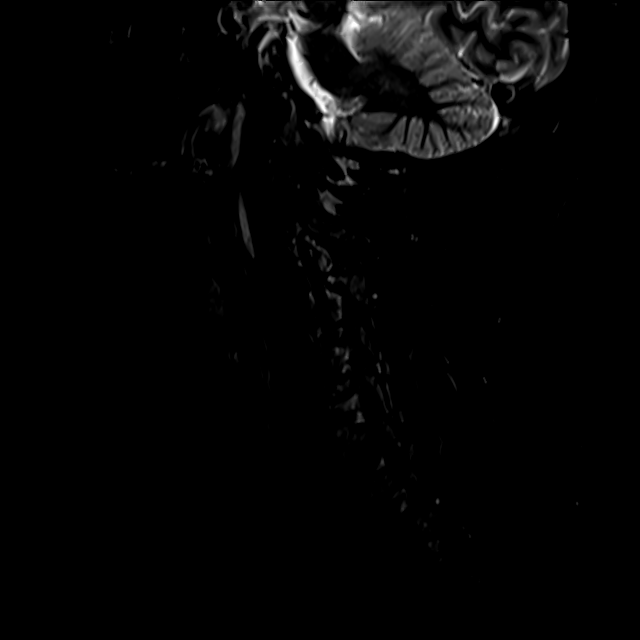
[im 6/15]
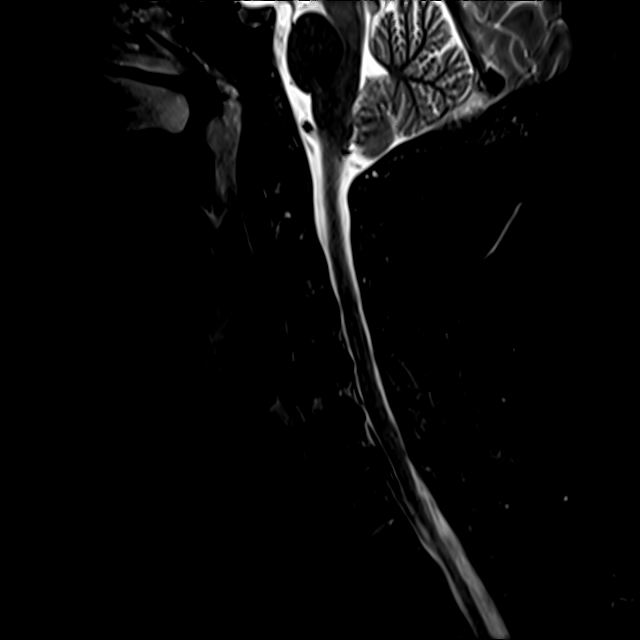
[im 9/15]
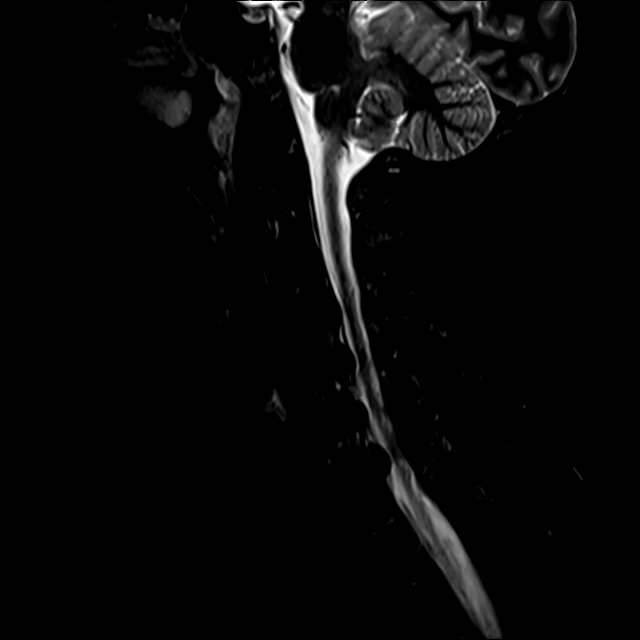
[im 15/15]
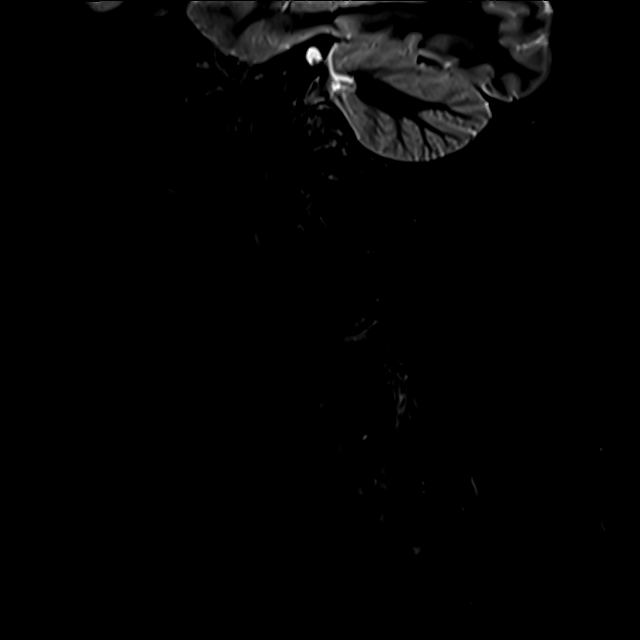

[Series 8: T2 · axial · 3.0mm · 0.50mm/px · z∈[-52,+52]mm · 8 of 34 slices shown (2 of 2)]
[im 1/34]
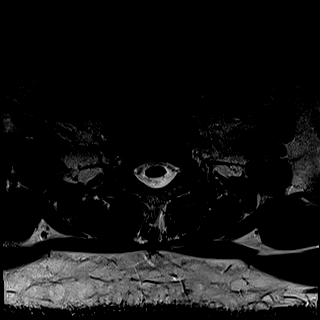
[im 6/34]
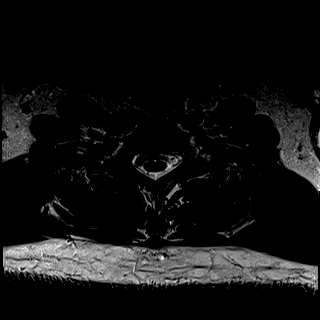
[im 11/34]
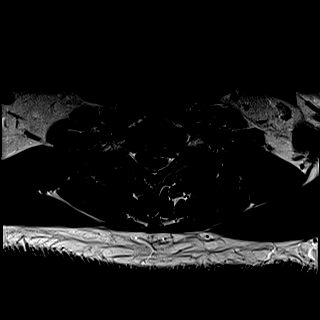
[im 16/34]
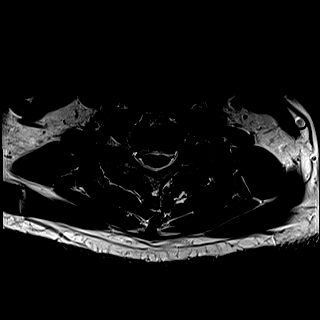
[im 18/34]
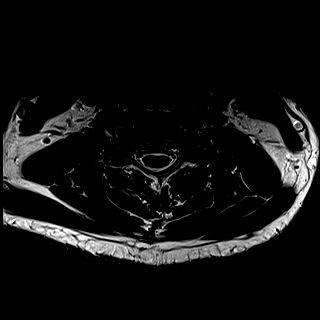
[im 23/34]
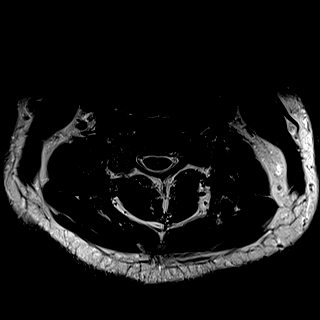
[im 28/34]
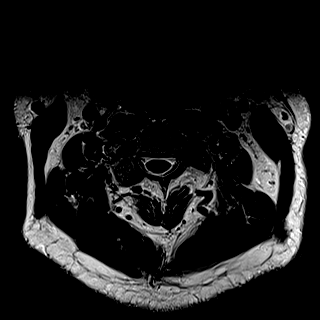
[im 34/34]
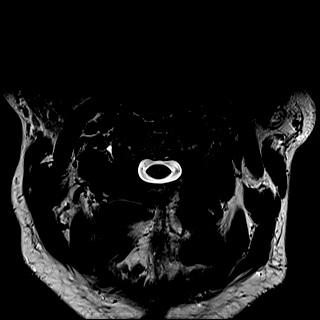

[27 of 48 positions shown; findings below may reference images not displayed]

FINDINGS: Alignment: Minimal curvature. Minimal retrolisthesis C4 and anterior
listhesis C6.

Vertebrae: Decreased signal intensity of bone marrow may be related
to patient's habitus. Correlation with CBC to exclude anemia
contributing to this appearance may be considered.

Small focal fatty deposit left C3 facet. Congenital bony overgrowth
right C3 facet.

1.6 cm hemangioma T2 vertebral body.

Cord: Mild motion artifact (most notable C6-7 level) without
discrete cord signal abnormality.

Posterior Fossa, vertebral arteries, paraspinal tissues: No acute
abnormality.

Disc levels:

C2-3:  Negative.

C3-4: Uncinate hypertrophy greater on left. Moderate left-sided and
mild right-sided foraminal narrowing. Minimal bulge/spur greater to
left. Minimal narrowing ventral thecal sac.

C4-5: Broad-based disc osteophyte complex slightly greater to left.
Narrowing ventral thecal sac and minimal cord contact greater on
left. Uncinate hypertrophy greater on left. Moderate left-sided and
mild right-sided foraminal narrowing.

C5-6: Mild bulge. Mild narrowing ventral thecal sac. Minimal
uncinate hypertrophy greater on the left. Minimal right-sided and
mild left-sided foraminal narrowing.

C6-7: Broad-based protrusion with cephalad and foraminal extension
greater on the left. Narrowing ventral thecal sac and slight cord
flattening greater on the left. Uncinate hypertrophy greater on
left. Moderate to marked left-sided and mild to moderate right-sided
foraminal narrowing.

C7-T1:  Negative.

T1-2:  Negative.
IMPRESSION: C6-7 broad-based protrusion with cephalad and foraminal extension
greater on the left. Narrowing ventral thecal sac and slight cord
flattening greater on the left. Uncinate hypertrophy greater on
left. Moderate to marked left-sided and mild to moderate right-sided
foraminal narrowing.

C5-6 mild bulge. Mild narrowing ventral thecal sac. Minimal
right-sided and mild left-sided foraminal narrowing.

C4-5 broad-based disc osteophyte complex slightly greater to left.
Narrowing ventral thecal sac and minimal cord contact greater on
left. Uncinate hypertrophy greater on left. Moderate left-sided and
mild right-sided foraminal narrowing.

C3-4 uncinate hypertrophy greater on left. Moderate left-sided and
mild right-sided foraminal narrowing. Minimal bulge/spur greater to
left. Minimal narrowing ventral thecal sac

Decreased signal intensity of bone marrow may be related to
patient's habitus. Correlation with CBC to exclude anemia
contributing to this appearance may be considered.

## 2017-03-09 ENCOUNTER — Other Ambulatory Visit: Payer: Self-pay | Admitting: Family Medicine

## 2017-03-09 MED ORDER — GLUCOSE BLOOD VI STRP: ORAL_STRIP | 12 refills | Status: DC

## 2017-03-11 ENCOUNTER — Telehealth: Payer: Self-pay | Admitting: Family Medicine

## 2017-03-11 MED ORDER — GLUCOSE BLOOD VI STRP: ORAL_STRIP | 12 refills | Status: DC

## 2017-03-11 NOTE — Telephone Encounter (Signed)
Pharmacy needs to know how many times the pt is using daily.

## 2017-03-11 NOTE — Addendum Note (Signed)
Addended by: Raj JanusADKINS, [place] Toor T on: 03/11/2017 09:28 AM   Modules accepted: Orders

## 2017-03-11 NOTE — Telephone Encounter (Signed)
Pharmacy is calling to get the frequency of how often the pt test himself insurance is requiring them to put in on the prescription.

## 2017-03-21 ENCOUNTER — Other Ambulatory Visit: Payer: Self-pay | Admitting: *Deleted

## 2017-03-21 MED ORDER — GLUCOSE BLOOD VI STRP: ORAL_STRIP | 12 refills | Status: AC

## 2017-05-18 ENCOUNTER — Other Ambulatory Visit: Payer: Self-pay | Admitting: Family Medicine

## 2017-05-19 ENCOUNTER — Other Ambulatory Visit: Payer: Self-pay | Admitting: Family Medicine

## 2017-08-09 ENCOUNTER — Other Ambulatory Visit: Payer: Self-pay | Admitting: Family Medicine

## 2017-08-13 ENCOUNTER — Other Ambulatory Visit: Payer: Self-pay | Admitting: Family Medicine

## 2017-09-01 ENCOUNTER — Other Ambulatory Visit: Payer: Self-pay | Admitting: Family Medicine

## 2017-11-24 ENCOUNTER — Other Ambulatory Visit: Payer: Self-pay | Admitting: *Deleted

## 2017-11-24 MED ORDER — PRAVASTATIN SODIUM 20 MG PO TABS: ORAL_TABLET | ORAL | 0 refills | Status: AC

## 2017-11-24 NOTE — Telephone Encounter (Signed)
Rx done. 

## 2017-11-26 ENCOUNTER — Other Ambulatory Visit: Payer: Self-pay | Admitting: Family Medicine

## 2018-02-19 ENCOUNTER — Other Ambulatory Visit: Payer: Self-pay | Admitting: Family Medicine

## 2018-04-10 ENCOUNTER — Other Ambulatory Visit: Payer: Self-pay | Admitting: Family Medicine

## 2018-05-03 ENCOUNTER — Other Ambulatory Visit: Payer: Self-pay | Admitting: Family Medicine

## 2018-05-17 ENCOUNTER — Other Ambulatory Visit: Payer: Self-pay | Admitting: Family Medicine
